# Patient Record
Sex: Male | Born: 1960 | Race: Black or African American | Hispanic: No | Marital: Single | Smoking: Current every day smoker
Health system: Southern US, Community
[De-identification: ages and names within clinical notes are randomized; demographics above are authoritative.]

## PROBLEM LIST (undated history)

## (undated) DIAGNOSIS — F1010 Alcohol abuse, uncomplicated: Secondary | ICD-10-CM

## (undated) DIAGNOSIS — K9423 Gastrostomy malfunction: Secondary | ICD-10-CM

## (undated) DIAGNOSIS — R4020 Unspecified coma: Secondary | ICD-10-CM

---

## 2018-12-30 ENCOUNTER — Emergency Department (HOSPITAL_COMMUNITY)
Admission: EM | Admit: 2018-12-30 | Discharge: 2018-12-31 | Disposition: A | Discharge status: Home / Self Care | Payer: Medicaid Other | Source: Home / Self Care | Attending: Emergency Medicine | Admitting: Emergency Medicine

## 2018-12-30 ENCOUNTER — Other Ambulatory Visit: Payer: Self-pay

## 2018-12-30 DIAGNOSIS — Z5900 Homelessness unspecified: Secondary | ICD-10-CM

## 2018-12-30 DIAGNOSIS — Z5320 Procedure and treatment not carried out because of patient's decision for unspecified reasons: Secondary | ICD-10-CM | POA: Insufficient documentation

## 2018-12-30 DIAGNOSIS — Y908 Blood alcohol level of 240 mg/100 ml or more: Secondary | ICD-10-CM | POA: Insufficient documentation

## 2018-12-30 DIAGNOSIS — Z931 Gastrostomy status: Secondary | ICD-10-CM | POA: Insufficient documentation

## 2018-12-30 DIAGNOSIS — F10920 Alcohol use, unspecified with intoxication, uncomplicated: Secondary | ICD-10-CM
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
## 2018-12-31 ENCOUNTER — Emergency Department (HOSPITAL_COMMUNITY): Payer: Medicaid Other

## 2018-12-31 ENCOUNTER — Inpatient Hospital Stay (HOSPITAL_COMMUNITY)
Admission: EM | Admit: 2018-12-31 | Discharge: 2019-01-01 | DRG: 641 | Discharge status: Against Medical Advice | Payer: Medicaid Other | Attending: Internal Medicine | Admitting: Internal Medicine

## 2018-12-31 ENCOUNTER — Other Ambulatory Visit: Payer: Self-pay

## 2018-12-31 DIAGNOSIS — R7989 Other specified abnormal findings of blood chemistry: Secondary | ICD-10-CM

## 2018-12-31 DIAGNOSIS — Z823 Family history of stroke: Secondary | ICD-10-CM

## 2018-12-31 DIAGNOSIS — F10220 Alcohol dependence with intoxication, uncomplicated: Secondary | ICD-10-CM | POA: Diagnosis present

## 2018-12-31 DIAGNOSIS — H547 Unspecified visual loss: Secondary | ICD-10-CM | POA: Diagnosis present

## 2018-12-31 DIAGNOSIS — Z931 Gastrostomy status: Secondary | ICD-10-CM

## 2018-12-31 DIAGNOSIS — Z20828 Contact with and (suspected) exposure to other viral communicable diseases: Secondary | ICD-10-CM | POA: Diagnosis present

## 2018-12-31 DIAGNOSIS — Z5329 Procedure and treatment not carried out because of patient's decision for other reasons: Secondary | ICD-10-CM | POA: Diagnosis not present

## 2018-12-31 DIAGNOSIS — E872 Acidosis, unspecified: Secondary | ICD-10-CM

## 2018-12-31 DIAGNOSIS — F10939 Alcohol use, unspecified with withdrawal, unspecified: Secondary | ICD-10-CM | POA: Diagnosis present

## 2018-12-31 DIAGNOSIS — E162 Hypoglycemia, unspecified: Secondary | ICD-10-CM | POA: Diagnosis present

## 2018-12-31 DIAGNOSIS — I10 Essential (primary) hypertension: Secondary | ICD-10-CM | POA: Diagnosis present

## 2018-12-31 DIAGNOSIS — E876 Hypokalemia: Secondary | ICD-10-CM

## 2018-12-31 DIAGNOSIS — F10920 Alcohol use, unspecified with intoxication, uncomplicated: Secondary | ICD-10-CM

## 2018-12-31 DIAGNOSIS — Z8249 Family history of ischemic heart disease and other diseases of the circulatory system: Secondary | ICD-10-CM

## 2018-12-31 DIAGNOSIS — Z833 Family history of diabetes mellitus: Secondary | ICD-10-CM

## 2018-12-31 DIAGNOSIS — F10239 Alcohol dependence with withdrawal, unspecified: Secondary | ICD-10-CM | POA: Diagnosis present

## 2018-12-31 DIAGNOSIS — F1020 Alcohol dependence, uncomplicated: Secondary | ICD-10-CM

## 2018-12-31 DIAGNOSIS — B1920 Unspecified viral hepatitis C without hepatic coma: Secondary | ICD-10-CM | POA: Diagnosis present

## 2018-12-31 DIAGNOSIS — Z590 Homelessness: Secondary | ICD-10-CM

## 2018-12-31 LAB — BASIC METABOLIC PANEL
Anion gap: 14 (ref 5–15)
BUN: 8 mg/dL (ref 6–20)
CO2: 14 mmol/L — ABNORMAL LOW (ref 22–32)
Calcium: 8.5 mg/dL — ABNORMAL LOW (ref 8.9–10.3)
Chloride: 110 mmol/L (ref 98–111)
Creatinine, Ser: 1.1 mg/dL (ref 0.61–1.24)
GFR calc Af Amer: >60 mL/min (ref >60)
GFR calc non Af Amer: >60 mL/min (ref >60)
Glucose, Bld: 75 mg/dL (ref 70–99)
Potassium: 3.6 mmol/L (ref 3.5–5.1)
Sodium: 138 mmol/L (ref 135–145)

## 2018-12-31 LAB — COMPREHENSIVE METABOLIC PANEL
ALT: 49 U/L — ABNORMAL HIGH (ref 0–44)
AST: 94 U/L — ABNORMAL HIGH (ref 15–41)
Albumin: 3 g/dL — ABNORMAL LOW (ref 3.5–5.0)
Alkaline Phosphatase: 152 U/L — ABNORMAL HIGH (ref 38–126)
Anion gap: 13 (ref 5–15)
BUN: 9 mg/dL (ref 6–20)
CO2: 18 mmol/L — ABNORMAL LOW (ref 22–32)
Calcium: 8.4 mg/dL — ABNORMAL LOW (ref 8.9–10.3)
Chloride: 109 mmol/L (ref 98–111)
Creatinine, Ser: 1.01 mg/dL (ref 0.61–1.24)
GFR calc Af Amer: >60 mL/min (ref >60)
GFR calc non Af Amer: >60 mL/min (ref >60)
Glucose, Bld: 68 mg/dL — ABNORMAL LOW (ref 70–99)
Potassium: 3.3 mmol/L — ABNORMAL LOW (ref 3.5–5.1)
Sodium: 140 mmol/L (ref 135–145)
Total Bilirubin: 0.7 mg/dL (ref 0.3–1.2)
Total Protein: 9 g/dL — ABNORMAL HIGH (ref 6.5–8.1)

## 2018-12-31 LAB — TROPONIN I (HIGH SENSITIVITY)
Troponin I (High Sensitivity): 4 ng/L (ref <18)
Troponin I (High Sensitivity): 5 ng/L (ref <18)

## 2018-12-31 LAB — CBC WITH DIFFERENTIAL/PLATELET
Abs Immature Granulocytes: 0.01 10*3/uL (ref 0.00–0.07)
Basophils Absolute: 0.2 10*3/uL — ABNORMAL HIGH (ref 0.0–0.1)
Basophils Relative: 3 %
Eosinophils Absolute: 0.8 10*3/uL — ABNORMAL HIGH (ref 0.0–0.5)
Eosinophils Relative: 12 %
HCT: 33.8 % — ABNORMAL LOW (ref 39.0–52.0)
Hemoglobin: 11.4 g/dL — ABNORMAL LOW (ref 13.0–17.0)
Immature Granulocytes: 0 %
Lymphocytes Relative: 48 %
Lymphs Abs: 3 10*3/uL (ref 0.7–4.0)
MCH: 29.6 pg (ref 26.0–34.0)
MCHC: 33.7 g/dL (ref 30.0–36.0)
MCV: 87.8 fL (ref 80.0–100.0)
Monocytes Absolute: 0.6 10*3/uL (ref 0.1–1.0)
Monocytes Relative: 9 %
Neutro Abs: 1.8 10*3/uL (ref 1.7–7.7)
Neutrophils Relative %: 28 %
Platelets: 227 10*3/uL (ref 150–400)
RBC: 3.85 MIL/uL — ABNORMAL LOW (ref 4.22–5.81)
RDW: 14.7 % (ref 11.5–15.5)
WBC: 6.3 10*3/uL (ref 4.0–10.5)
nRBC: 0 % (ref 0.0–0.2)

## 2018-12-31 LAB — CBC
HCT: 35 % — ABNORMAL LOW (ref 39.0–52.0)
Hemoglobin: 11.2 g/dL — ABNORMAL LOW (ref 13.0–17.0)
MCH: 29.8 pg (ref 26.0–34.0)
MCHC: 32 g/dL (ref 30.0–36.0)
MCV: 93.1 fL (ref 80.0–100.0)
Platelets: 142 10*3/uL — ABNORMAL LOW (ref 150–400)
RBC: 3.76 MIL/uL — ABNORMAL LOW (ref 4.22–5.81)
RDW: 15.1 % (ref 11.5–15.5)
WBC: 8.5 10*3/uL (ref 4.0–10.5)
nRBC: 0 % (ref 0.0–0.2)

## 2018-12-31 LAB — CBG MONITORING, ED
Glucose-Capillary: 68 mg/dL — ABNORMAL LOW (ref 70–99)
Glucose-Capillary: 72 mg/dL (ref 70–99)
Glucose-Capillary: 108 mg/dL — ABNORMAL HIGH (ref 70–99)
Glucose-Capillary: 94 mg/dL (ref 70–99)

## 2018-12-31 LAB — LACTIC ACID, PLASMA
Lactic Acid, Venous: 3.8 mmol/L (ref 0.5–1.9)
Lactic Acid, Venous: 3.7 mmol/L (ref 0.5–1.9)

## 2018-12-31 LAB — ETHANOL: Alcohol, Ethyl (B): 289 mg/dL — ABNORMAL HIGH (ref <10)

## 2018-12-31 MED ORDER — SODIUM CHLORIDE 0.9 % IV BOLUS
1000.0000 mL | Freq: Once | INTRAVENOUS | Status: AC
  Administered 2019-01-01: 01:00:00 1000 mL via INTRAVENOUS

## 2018-12-31 MED ORDER — SODIUM CHLORIDE 0.9 % IV BOLUS
1000.0000 mL | Freq: Once | INTRAVENOUS | Status: AC
  Administered 2018-12-31: 1000 mL via INTRAVENOUS

## 2018-12-31 MED ORDER — CHLORDIAZEPOXIDE HCL 25 MG PO CAPS
50.0000 mg | ORAL_CAPSULE | Freq: Once | ORAL | Status: AC
  Administered 2018-12-31: 50 mg via ORAL
  Filled 2018-12-31: qty 2

## 2018-12-31 NOTE — ED Triage Notes (Signed)
Per EMS pt called for CP for x3 days. Pt seen here last day or 2 for same LWBS. Pt refused ASA and nitro from EMS because it hurts his stomach. Pt rates CP 9/10 currently. Pt currently intoxicated, very diaphoretic. Pt has peg tube, urinated on himself en route.

## 2018-12-31 NOTE — ED Notes (Signed)
2nd call for pt to recheck vitals. No response.  
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
## 2018-12-31 NOTE — ED Provider Notes (Signed)
[facility] EMERGENCY DEPARTMENT Provider Note   CSN: [id number] Arrival date & time: 12/31/18  1614    History   Chief Complaint Chief Complaint  Patient presents with   Chest Pain    HPI Luis Wu is a 58 y.o. male.   The history is provided by the patient. The history is limited by the condition of the patient (Intoxicated).  He is extremely intoxicated and is very difficult to get a history for him.  He states that he was here yesterday and he left but was supposed to see a Education officer, museum to help him go through a detox program.  He states that he is a heavy drinker but wants to stop drinking.  Triage note states that he had some chest pain in the patient actually is not complaining of chest pain currently and I cannot get any details from him.  He denies other drug use.  He denies hallucinations, homicidal ideation, suicidal ideation.  No past medical history on file.  There are no active problems to display for this patient.   ** The histories are not reviewed yet. Please review them in the "History" navigator section and refresh this [place].      Home Medications    Prior to Admission medications   Not on File    Family History No family history on file.  Social History Social History   Tobacco Use   Smoking status: Not on file  Substance Use Topics   Alcohol use: Not on file   Drug use: Not on file     Allergies   Patient has no known allergies.   Review of Systems Review of Systems  Unable to perform ROS: Mental status change     Physical Exam Updated Vital Signs BP (!) 147/86    Pulse (!) 104    Temp 98.6 F (37 C) (Oral)    Resp 18    SpO2 97%   Physical Exam Vitals signs and nursing note reviewed.    58 year old male, resting comfortably and in no acute distress. Vital signs are significant for mildly elevated heart rate and blood pressure. Oxygen saturation is 97%, which is normal. Head is normocephalic  and atraumatic. PERRLA, EOMI. Oropharynx is clear. Neck is nontender and supple without adenopathy or JVD.  Scar from prior tracheostomy present. Back is nontender and t here is no CVA tenderness. Lungs are clear without rales, wheezes, or rhonchi. Chest is nontender. Heart has regular rate and rhythm without murmur. Abdomen is soft, flat, with mild epigastric tenderness.  There is no rebound or guarding.  There are no masses or hepatosplenomegaly and peristalsis is normoactive.  PEG tube present left upper quadrant. Extremities have no cyanosis or edema, full range of motion is present. Skin is warm and dry without rash. Neurologic: Clinically intoxicated with slurred speech, cranial nerves are intact, there are no gross motor or sensory deficits.  ED Treatments / Results  Labs (all labs ordered are listed, but only abnormal results are displayed) Labs Reviewed  BASIC METABOLIC PANEL - Abnormal; Notable for the following components:      Result Value   CO2 14 (*)    Calcium 8.5 (*)    All other components within normal limits  CBC - Abnormal; Notable for the following components:   RBC 3.76 (*)    Hemoglobin 11.2 (*)    HCT 35.0 (*)    Platelets 142 (*)    All other components within normal  limits  TROPONIN I (HIGH SENSITIVITY)  TROPONIN I (HIGH SENSITIVITY)    EKG EKG Interpretation  Date/Time:  Thursday December 31 2018 16:16:26 EDT Ventricular Rate:  98 PR Interval:  158 QRS Duration: 92 QT Interval:  362 QTC Calculation: 462 R Axis:   20 Text Interpretation: Normal sinus rhythm Nonspecific T wave abnormality Abnormal ECG No old tracing to compare Confirmed by Dione Booze [phone]) on 12/31/2018 11:00:40 PM   Radiology Dg Chest 2 View  Result Date: 12/31/2018 CLINICAL DATA:  Chest pain EXAM: CHEST - 2 VIEW COMPARISON:  None. FINDINGS: There is mild scarring in the left base. There is no edema or consolidation. Heart size and pulmonary vascularity are normal. No  adenopathy. No pneumothorax. No bone lesions. IMPRESSION: Mild scarring in the left base. No edema or consolidation. Cardiac silhouette within normal limits. Electronically Signed   By: Bretta Bang III M.D.   On: 12/31/2018 16:55    Procedures Procedures   Medications Ordered in ED Medications  sodium chloride 0.9 % bolus 1,000 mL (has no administration in time range)     Initial Impression / Assessment and Plan / ED Course  I have reviewed the triage vital signs and the nursing notes.  Pertinent labs & imaging results that were available during my care of the patient were reviewed by me and considered in my medical decision making (see chart for details).   Alcohol intoxication.  Old records are reviewed, and patient was here yesterday but apparently social service consult was to try to get patient into see a surgeon to have PEG tube removed.  He does not demonstrate any psychiatric disturbance which would warrant TTS evaluation.  Labs do show low CO2 with normal anion gap.  ECG shows no acute changes, troponin is normal x2, chest x-ray is unremarkable.  He will be given IV fluids.  Yesterday, he had an elevated lactic acid.  We will recheck lactic acid.  Lactic acid level has come down, but not completely to normal.  He continues to run a normal anion gap metabolic acidosis.  Glucose is also coming down and he is given a dose of D50.  He is also given a dose of oral potassium because potassium dropped to 3.0.  It is felt that he will need more observation as an inpatient.  Case is discussed with Dr. Crista Elliot of internal medicine teaching service agrees to admit the patient.  Final Clinical Impressions(s) / ED Diagnoses   Final diagnoses:  Metabolic acidosis  Elevated lactic acid level  Alcohol intoxication, uncomplicated (HCC)  Alcohol use disorder, severe, dependence (HCC)  Hypokalemia    ED Discharge Orders    None       Dione Booze, MD 01/01/19 [phone]

## 2018-12-31 NOTE — ED Notes (Addendum)
Called pt to recheck vitals. No response.  

## 2018-12-31 NOTE — ED Notes (Signed)
MD Melina Copa discharged pt due to his unwillingly to stay to wait on his fluids to infuse as well as social work consult. Pt states he has plan to take bus [facility]. Pt wanted to be taken to a ATM machine so was discharged and sectary taken to atm.

## 2018-12-31 NOTE — ED Provider Notes (Signed)
Signout from Dr. Leonette Monarch.  58 year old male recently moved here, homeless without a PCP, here with alcohol intoxication found less responsive.  Patient himself is looking for somebody to evaluate his trach site and wants his PEG removed. Physical Exam  BP (!) 144/87 (BP Location: Left Arm)   Pulse 86   Temp 98.6 F (37 C) (Oral)   Resp 18   SpO2 100%   Physical Exam  ED Course/Procedures     Procedures  MDM  Lactate elevated at 3.8 and minimally cleared after some IV fluids.  Patient getting more IV fluids and some oral Librium for possible withdrawal symptoms.  Case management consult has been placed.  Plan is likely discharge after more fluids and repeat labs showing an improved BMP and lactate.  8:20 AM.  Patient is received some Librium but have been informed by the nurse that he says he needs to leave.  He says he wants to go to an ATM and then go get some liquor and then he will come back and be evaluated again.  He understands that he will need to sign in again.  We also talked about the fact that he has an elevated lactate and may be sicker than he realizes.  He declines to stay here for a more treatment and for discussion with case management about his options for housing.       Hayden Rasmussen, MD 12/31/18 [phone]

## 2018-12-31 NOTE — ED Notes (Signed)
Last call for pt. No response.  

## 2018-12-31 NOTE — ED Provider Notes (Signed)
[facility] EMERGENCY [facility]PART[facility] Provider Note  CSN: [id number] Arrival date & time: 12/30/18 1456  Chief Complaint(s) Alcohol Intoxication  HPI Luis Wu is a 58 y.o. male who was brought in by EMS after somebody called out for likely alcohol intoxication.  Patient was found with bottles of liquor around him.  Patient apparently moved from [place] 2 days ago.  He has bandage on his anterior neck from a trach removal.  He reports that the trach was removed 1 month ago and his bandage has been on for a month.  Patient also has a PEG tube in place.  He reports that he is able to drink and eat and does not need the PEG tube anymore.  He requests it be taken out.  Patient does not remember why he had these procedures.  Currently has no physical complaints.  Reports that he does not have a place to stay.   HPI  Past Medical History No past medical history on file. There are no active problems to display for this patient.  Home Medication(s) Prior to Admission medications   Not on File                                                                                                                                    Past Surgical History ** The histories are not reviewed yet. Please review them in the "History" navigator section and refresh this [place]. Family History No family history on file.  Social History Social History   Tobacco Use  . Smoking status: Not on file  Substance Use Topics  . Alcohol use: Not on file  . Drug use: Not on file   Allergies Patient has no known allergies.  Review of Systems Review of Systems All other systems are reviewed and are negative for acute change except as noted in the HPI  Physical Exam Vital Signs  I have reviewed the triage vital signs BP (!) 144/87 (BP Location: Left Arm)   Pulse 86   Temp 98.6 F (37 C) (Oral)   Resp 18   SpO2 100%   Physical Exam Vitals signs reviewed.  Constitutional:    General: He is not in acute distress.    Appearance: He is well-developed. He is not diaphoretic.  HENT:     Head: Normocephalic and atraumatic.     Nose: Nose normal.  Eyes:     General: No scleral icterus.       Right eye: No discharge.        Left eye: No discharge.     Conjunctiva/sclera: Conjunctivae normal.     Pupils: Pupils are equal, round, and reactive to light.  Neck:     Musculoskeletal: Normal range of motion and neck supple.   Cardiovascular:     Rate and Rhythm: Normal rate and regular rhythm.     Heart sounds: No murmur. No friction rub. No gallop.  Pulmonary:     Effort: Pulmonary effort is normal. No respiratory distress.     Breath sounds: Normal breath sounds. No stridor. No rales.  Abdominal:     General: There is no distension.     Palpations: Abdomen is soft.     Tenderness: There is no abdominal tenderness.    Musculoskeletal:        General: No tenderness.  Skin:    General: Skin is warm and dry.     Findings: No erythema or rash.  Neurological:     Mental Status: He is alert and oriented to person, place, and time.       ED Results and Treatments Labs (all labs ordered are listed, but only abnormal results are displayed) Labs Reviewed  LACTIC ACID, PLASMA - Abnormal; Notable for the following components:      Result Value   Lactic Acid, Venous 3.8 (*)    All other components within normal limits  COMPREHENSIVE METABOLIC PANEL - Abnormal; Notable for the following components:   Potassium 3.3 (*)    CO2 18 (*)    Glucose, Bld 68 (*)    Calcium 8.4 (*)    Total Protein 9.0 (*)    Albumin 3.0 (*)    AST 94 (*)    ALT 49 (*)    Alkaline Phosphatase 152 (*)    All other components within normal limits  CBC WITH DIFFERENTIAL/PLATELET - Abnormal; Notable for the following components:   RBC 3.85 (*)    Hemoglobin 11.4 (*)    HCT 33.8 (*)    Eosinophils Absolute 0.8 (*)    Basophils Absolute 0.2 (*)    All other components within normal  limits  ETHANOL - Abnormal; Notable for the following components:   Alcohol, Ethyl (B) 289 (*)    All other components within normal limits  CBG MONITORING, ED - Abnormal; Notable for the following components:   Glucose-Capillary 68 (*)    All other components within normal limits  CBG MONITORING, ED - Abnormal; Notable for the following components:   Glucose-Capillary 108 (*)    All other components within normal limits  LACTIC ACID, PLASMA  URINALYSIS, ROUTINE W REFLEX MICROSCOPIC  LACTIC ACID, PLASMA  CBG MONITORING, ED  CBG MONITORING, ED                                                                                                                         EKG  EKG Interpretation  Date/Time:    Ventricular Rate:    PR Interval:    QRS Duration:   QT Interval:    QTC Calculation:   R Axis:     Text Interpretation:        Radiology No results found.  Pertinent labs & imaging results that were available during my care of the patient were reviewed by me and considered in my medical decision making (see chart for details).  Medications Ordered in ED Medications  sodium chloride  0.9 % bolus 1,000 mL (0 mLs Intravenous Stopped 12/31/18 0411)                                                                                                                                    Procedures Procedures  (including critical care time)  Medical Decision Making / ED Course I have reviewed the nursing notes for this encounter and the patient's prior records (if available in EHR or on provided paperwork).   Ademola Dion was evaluated in Emergency Department on 12/31/2018 for the symptoms described in the history of present illness. He was evaluated in the context of the global COVID-19 pandemic, which necessitated consideration that the patient might be at risk for infection with the SARS-CoV-2 virus that causes COVID-19. Institutional protocols and algorithms that pertain to the  evaluation of patients at risk for COVID-19 are in a state of rapid change based on information released by regulatory bodies including the CDC and federal and state organizations. These policies and algorithms were followed during the patient's care in the ED.  Brought in for alcohol intoxication. Janina Mayo site is well-appearing without evidence of infection. PEG tube in place without evidence of infection.  Abdomen benign. Labs without leukocytosis or significant anemia; no significant electrolyte derangements.  Patient does have mild hypoglycemia.  Given food and oral hydration. EtOH still elevated. Lactic acid also elevated.  Patient afebrile without source of infection.  Likely related to alcohol.  Patient given IV fluids.  Repeat lactic acid pending.  Will consult case management to assist with surgery follow up for PEG removal.   Patient care turned over to Dr Charm Barges. Patient case and results discussed in detail; please see their note for further ED managment.       Final Clinical Impression(s) / ED Diagnoses Final diagnoses:  Alcoholic intoxication without complication (HCC)  Homeless  PEG (percutaneous endoscopic gastrostomy) status (HCC)      This chart was dictated using voice recognition software.  Despite best efforts to proofread,  errors can occur which can change the documentation meaning.   Nira Conn, MD 12/31/18 [phone]

## 2019-01-01 ENCOUNTER — Emergency Department (HOSPITAL_COMMUNITY)
Admission: EM | Admit: 2019-01-01 | Discharge: 2019-01-02 | Discharge status: Against Medical Advice | Payer: Medicaid Other | Attending: Emergency Medicine | Admitting: Emergency Medicine

## 2019-01-01 ENCOUNTER — Inpatient Hospital Stay (HOSPITAL_COMMUNITY): Payer: Medicaid Other

## 2019-01-01 DIAGNOSIS — B1920 Unspecified viral hepatitis C without hepatic coma: Secondary | ICD-10-CM | POA: Diagnosis present

## 2019-01-01 DIAGNOSIS — Z833 Family history of diabetes mellitus: Secondary | ICD-10-CM | POA: Diagnosis not present

## 2019-01-01 DIAGNOSIS — F1020 Alcohol dependence, uncomplicated: Secondary | ICD-10-CM | POA: Insufficient documentation

## 2019-01-01 DIAGNOSIS — F10239 Alcohol dependence with withdrawal, unspecified: Secondary | ICD-10-CM | POA: Diagnosis present

## 2019-01-01 DIAGNOSIS — Z5321 Procedure and treatment not carried out due to patient leaving prior to being seen by health care provider: Secondary | ICD-10-CM | POA: Diagnosis not present

## 2019-01-01 DIAGNOSIS — F10220 Alcohol dependence with intoxication, uncomplicated: Secondary | ICD-10-CM | POA: Diagnosis present

## 2019-01-01 DIAGNOSIS — F1010 Alcohol abuse, uncomplicated: Secondary | ICD-10-CM | POA: Diagnosis present

## 2019-01-01 DIAGNOSIS — Z823 Family history of stroke: Secondary | ICD-10-CM | POA: Diagnosis not present

## 2019-01-01 DIAGNOSIS — Z5900 Homelessness unspecified: Secondary | ICD-10-CM

## 2019-01-01 DIAGNOSIS — F10139 Alcohol abuse with withdrawal, unspecified: Secondary | ICD-10-CM

## 2019-01-01 DIAGNOSIS — Z930 Tracheostomy status: Secondary | ICD-10-CM

## 2019-01-01 DIAGNOSIS — Z5329 Procedure and treatment not carried out because of patient's decision for other reasons: Secondary | ICD-10-CM | POA: Diagnosis not present

## 2019-01-01 DIAGNOSIS — F10129 Alcohol abuse with intoxication, unspecified: Secondary | ICD-10-CM | POA: Diagnosis present

## 2019-01-01 DIAGNOSIS — E872 Acidosis, unspecified: Secondary | ICD-10-CM

## 2019-01-01 DIAGNOSIS — F10929 Alcohol use, unspecified with intoxication, unspecified: Secondary | ICD-10-CM | POA: Insufficient documentation

## 2019-01-01 DIAGNOSIS — Z20828 Contact with and (suspected) exposure to other viral communicable diseases: Secondary | ICD-10-CM | POA: Diagnosis present

## 2019-01-01 DIAGNOSIS — H547 Unspecified visual loss: Secondary | ICD-10-CM | POA: Diagnosis present

## 2019-01-01 DIAGNOSIS — F10939 Alcohol use, unspecified with withdrawal, unspecified: Secondary | ICD-10-CM | POA: Diagnosis present

## 2019-01-01 DIAGNOSIS — I10 Essential (primary) hypertension: Secondary | ICD-10-CM

## 2019-01-01 DIAGNOSIS — E876 Hypokalemia: Secondary | ICD-10-CM | POA: Diagnosis present

## 2019-01-01 DIAGNOSIS — Z8249 Family history of ischemic heart disease and other diseases of the circulatory system: Secondary | ICD-10-CM | POA: Diagnosis not present

## 2019-01-01 DIAGNOSIS — E162 Hypoglycemia, unspecified: Secondary | ICD-10-CM | POA: Diagnosis present

## 2019-01-01 DIAGNOSIS — Z931 Gastrostomy status: Secondary | ICD-10-CM | POA: Diagnosis not present

## 2019-01-01 LAB — BASIC METABOLIC PANEL
Anion gap: 13 (ref 5–15)
Anion gap: 10 (ref 5–15)
BUN: 5 mg/dL — ABNORMAL LOW (ref 6–20)
BUN: 5 mg/dL — ABNORMAL LOW (ref 6–20)
CO2: 17 mmol/L — ABNORMAL LOW (ref 22–32)
CO2: 15 mmol/L — ABNORMAL LOW (ref 22–32)
Calcium: 8.5 mg/dL — ABNORMAL LOW (ref 8.9–10.3)
Calcium: 6.6 mg/dL — ABNORMAL LOW (ref 8.9–10.3)
Chloride: 108 mmol/L (ref 98–111)
Chloride: 117 mmol/L — ABNORMAL HIGH (ref 98–111)
Creatinine, Ser: 1.13 mg/dL (ref 0.61–1.24)
Creatinine, Ser: 0.86 mg/dL (ref 0.61–1.24)
GFR calc Af Amer: >60 mL/min (ref >60)
GFR calc Af Amer: >60 mL/min (ref >60)
GFR calc non Af Amer: >60 mL/min (ref >60)
GFR calc non Af Amer: >60 mL/min (ref >60)
Glucose, Bld: 77 mg/dL (ref 70–99)
Glucose, Bld: 67 mg/dL — ABNORMAL LOW (ref 70–99)
Potassium: 3.3 mmol/L — ABNORMAL LOW (ref 3.5–5.1)
Potassium: 3 mmol/L — ABNORMAL LOW (ref 3.5–5.1)
Sodium: 138 mmol/L (ref 135–145)
Sodium: 142 mmol/L (ref 135–145)

## 2019-01-01 LAB — RAPID URINE DRUG SCREEN, HOSP PERFORMED
Amphetamines: NOT DETECTED
Barbiturates: NOT DETECTED
Benzodiazepines: POSITIVE — AB
Cocaine: NOT DETECTED
Opiates: NOT DETECTED
Tetrahydrocannabinol: NOT DETECTED

## 2019-01-01 LAB — LACTIC ACID, PLASMA
Lactic Acid, Venous: 3.3 mmol/L (ref 0.5–1.9)
Lactic Acid, Venous: 2 mmol/L (ref 0.5–1.9)

## 2019-01-01 LAB — ETHANOL: Alcohol, Ethyl (B): 262 mg/dL — ABNORMAL HIGH (ref <10)

## 2019-01-01 MED ORDER — ADULT MULTIVITAMIN W/MINERALS CH: 1.0000 | ORAL_TABLET | Freq: Every day | ORAL | Status: DC

## 2019-01-01 MED ORDER — POTASSIUM CHLORIDE CRYS ER 20 MEQ PO TBCR
40.0000 meq | EXTENDED_RELEASE_TABLET | Freq: Once | ORAL | Status: AC
  Administered 2019-01-01: 07:00:00 40 meq via ORAL
  Filled 2019-01-01: qty 2

## 2019-01-01 MED ORDER — ACETAMINOPHEN 325 MG PO TABS: 650.0000 mg | ORAL_TABLET | Freq: Four times a day (QID) | ORAL | Status: DC | PRN

## 2019-01-01 MED ORDER — AMLODIPINE BESYLATE 5 MG PO TABS: 5.0000 mg | ORAL_TABLET | Freq: Every day | ORAL | Status: DC

## 2019-01-01 MED ORDER — LORAZEPAM 2 MG/ML IJ SOLN
1.0000 mg | INTRAMUSCULAR | Status: DC | PRN
  Administered 2019-01-01: 08:00:00 2 mg via INTRAVENOUS
  Filled 2019-01-01: qty 1

## 2019-01-01 MED ORDER — THIAMINE HCL 100 MG/ML IJ SOLN: 100.0000 mg | Freq: Every day | INTRAMUSCULAR | Status: DC

## 2019-01-01 MED ORDER — LORAZEPAM 1 MG PO TABS: 1.0000 mg | ORAL_TABLET | ORAL | Status: DC | PRN

## 2019-01-01 MED ORDER — DEXTROSE 50 % IV SOLN
50.0000 mL | Freq: Once | INTRAVENOUS | Status: AC
  Administered 2019-01-01: 07:00:00 50 mL via INTRAVENOUS
  Filled 2019-01-01: qty 50

## 2019-01-01 MED ORDER — FOLIC ACID 1 MG PO TABS: 1.0000 mg | ORAL_TABLET | Freq: Every day | ORAL | Status: DC

## 2019-01-01 MED ORDER — CHLORDIAZEPOXIDE HCL 25 MG PO CAPS: 25.0000 mg | ORAL_CAPSULE | Freq: Four times a day (QID) | ORAL | Status: DC

## 2019-01-01 MED ORDER — SODIUM CHLORIDE 0.9 % IV BOLUS
1000.0000 mL | Freq: Once | INTRAVENOUS | Status: AC
  Administered 2019-01-01: 1000 mL via INTRAVENOUS

## 2019-01-01 MED ORDER — CHLORDIAZEPOXIDE HCL 25 MG PO CAPS: 25.0000 mg | ORAL_CAPSULE | Freq: Three times a day (TID) | ORAL | Status: DC

## 2019-01-01 MED ORDER — ACETAMINOPHEN 650 MG RE SUPP: 650.0000 mg | Freq: Four times a day (QID) | RECTAL | Status: DC | PRN

## 2019-01-01 MED ORDER — VITAMIN B-1 100 MG PO TABS: 100.0000 mg | ORAL_TABLET | Freq: Every day | ORAL | Status: DC

## 2019-01-01 MED ORDER — ENOXAPARIN SODIUM 40 MG/0.4ML ~~LOC~~ SOLN: 40.0000 mg | SUBCUTANEOUS | Status: DC

## 2019-01-01 NOTE — ED Notes (Signed)
Pt remains agitated walking around nursing station.  Emphatic about leaving.  Resident paged and here to review his sx, pt refuses to stay or sign AMA.  IV dc'd and left ambulatory with his belongings.

## 2019-01-01 NOTE — ED Notes (Signed)
Pt ambulated self efficiently to restroom with no difficulty. 

## 2019-01-01 NOTE — H&P (Signed)
Date: 01/01/2019               Patient Name:  Luis Wu MRN: [id number]  DOB: 1960-04-26 Age / Sex: 58 y.o., male   PCP: System, Pcp Not In         Medical Service: Internal Medicine Teaching Service         Attending Physician: Dr. Rayne Du att. providers found    First Contact: Dr. Gilford Rile Pager: [phone]  Second Contact: Dr. Myrtie Hawk Pager: [phone]       After Hours (After 5p/  First Contact Pager: [phone]  weekends / holidays): Second Contact Pager: [phone]   Chief Complaint: "I want to detox from alcohol"  History of Present Illness: Luis Wu is a 58 year old male with past medical history notable for hypertension, EtOH use disorder, recent hospitalization with tracheostomy and PEG tube placement for unspecified etiology who presented to [facility] emergency department with a desire for [place] to assist him with his alcohol use disorder.  The patient states that he no longer wishes to use alcohol and has been experiencing diaphoresis and tremors for the past 10 hours since his last drink 20 hours ago.  The patient endorses chronic stable poor vision, mild headache, tremor, diaphoresis, and weakness.  He denies hallucinations, syncope, confusion.  Patient denies fever but endorses chills.  He denies chest pain, abdominal pain, dysuria, hematuria, constipation, diarrhea, joint aches or pains, myalgias or rash. He states that although he was born in [place] he had moved and lived in [place], [place] the majority of his life.  At some point he states that due to alcohol use disorder he found himself admitted to the hospital in [place], he believes this was [facility].  He does not recall what medical condition prompted the need for placement of a PEG tube which remains in place and or tracheostomy with evidence of recent closure given the persistent presence of Dermabond and the stage of the wound.  The patient stated that he purchased a bus ticket for his ride  to [facility] has been staying in a hotel since arrival.  He does not remember how long he has been present.  He is agreeable to stay for treatment.  He denies having family or friends who could assist with explaining his condition but did provide the number to his ex-wife at [phone].  Meds:  The patient denied having prescriptions for any medications prior to admission.  Past medical history: The patient endorses a past medical history of hypertension and hepatitis C (untreated)  Allergies: The patient denied any known drug allergies.  Family History:  The patient endorsed a family history of stroke in his mother in her 35s but she remains living The patient believes his father had diabetes and hypertension  Social History:  He is currently on home Does not smoke, has used crack cocaine in the past Consumes at minimum 60 ounces of beer daily and at least 1 pint of wine daily  Review of Systems: A complete ROS was negative except as per HPI.   Physical Exam: Blood pressure (!) 144/98, pulse 95, temperature 98.6 F (37 C), temperature source Oral, resp. rate (!) 24, SpO2 98 %. Physical Exam Vitals signs and nursing note reviewed.  Constitutional:      General: He is not in acute distress.    Appearance: He is well-developed. He is not diaphoretic.  HENT:     Head: Normocephalic and atraumatic.  Eyes:  Conjunctiva/sclera: Conjunctivae normal.  Neck:     Musculoskeletal: Normal range of motion.  Cardiovascular:     Rate and Rhythm: Regular rhythm. Tachycardia present.     Heart sounds: No murmur.  Pulmonary:     Effort: Pulmonary effort is normal. No respiratory distress.     Breath sounds: Normal breath sounds. No stridor.  Abdominal:     General: Bowel sounds are normal. There is no distension.     Palpations: Abdomen is soft.  Skin:    General: Skin is warm.  Neurological:     Mental Status: He is alert and oriented to person, place, and time.       Motor: Tremor present.     Comments: The patient is alert and oriented but with slow response time.    EKG: personally reviewed my interpretation is NSR  CXR: personally reviewed my interpretation is unremarkable, no acute cardiopulmonary process, no bony fractures noted  Assessment & Plan by Problem: Principal Problem:   Alcohol withdrawal (HCC) Active Problems:   Acidosis, metabolic   Hypokalemia  Assessment: [name] is a 58 year old male with past medical history notable for hypertension, EtOH use disorder, recent hospitalization with tracheostomy and PEG tube placement for unspecified etiology who presented to [facility] emergency department with a desire for [place] to assist him with his alcohol use disorder.  Presentation the patient was noted to have a marked tremor and a mildly decreased response time.  Additionally he was found to have a potassium of 3.0, lactate of 2.0 improved from 3.8, bicarb of 15, creatinine 0.86, with no anion gap.  He was admitted for evaluation of his lactic acidosis and nongap acidosis.  Plan: EtOH abuse with withdrawal: Marked use disorder, to the extent that the patient has no recollection of his history of presentation.  He consumes alcohol in great excess daily and has had withdrawal symptoms in the past.  Currently not experiencing hallucinations but tremor and diaphoresis are present.  The patient met admission criteria for alcohol withdrawal was admitted to telemetry bed.  The patient also has a mild elevation in his AST, ALT and alk phos which warrants further evaluation. -Monitor electrolytes including Phos, mag and potassium daily -CIWA MedSurg protocol every 6 hours as needed with Ativan -Librium 25 4 times daily on day 1 tapering dose -CMP to monitor transaminases  Patient with a history of hepatitis C: We will order serologies to evaluate hepatitis C in order B. -Hepatitis C antibody with reflex ordered -Hepatitis B core antibody,  surface antibody and antigen ordered  PEG tube: Etiology of need uncertain. Patient does not recall. Has on contact information. We will attempt to reach out to his hospital if he returns.  --KUB for status  Nongap metabolic acidosis: His acidosis was initially at least partially explained by his elevated lactate.  Etiology uncertain additional labs were ordered but unfortunately patient opted to leave [place] prior to completion of his work-up.  HTN: Marked hypertension on admission.  Patient endorses no history of treated high blood pressure in the past. -Initiate amlodipine 5 mg daily  Diet: Regular Fluids: N/A Code: Full DVT PPX: Lovenox Dispo: Admit patient to Inpatient with expected length of stay greater than 2 midnights.  Signed: Kathi Ludwig, MD 01/01/2019, 9:16 AM  Pager: # [phone]  Prior to completion of this note, I was paged by nursing staff that the patient was wishing to leave [place].  Fortunately, I arrived in time to the patient's room  to discuss this with him, to which she is adamant.  The patient refuses to remain hospitalized but is alert and oriented and appears to be of sound mind capable of making this decision.  He denied having additional contacts for me to reach out to him.  I informed him that I had called the number he had previously given me and when the individual answered she did not give her name but stated that the last she had heard Mr. Cocozza was that he was in [place].  When I informed her that he had given me permission to discuss his care with her, she disconnected the phone call.  I again informed the patient that he would be leaving the hospital [place] given his multiple lab derangements.  He again refused to stay and said that at some point he may return he felt like it.

## 2019-01-01 NOTE — ED Notes (Signed)
Card has been found, pt medicated with Ativan and remains anxious and up walking in room, despite multiple attempts to redirect.  Pt states now he is leaving once he found the amount of money on his card.  Balance obtained with assist of tech.

## 2019-01-01 NOTE — ED Triage Notes (Signed)
[facility] after bystanders witnessed pt staggering. Pt reports only had a few beers. Pt wanting to have his gtube looked at while he is here. 124/86, HR 104, 96% RA, 98.58F CBG 97.

## 2019-01-02 MED ORDER — CHLORDIAZEPOXIDE HCL 25 MG PO CAPS
25.0000 mg | ORAL_CAPSULE | Freq: Once | ORAL | Status: AC
  Administered 2019-01-02: 25 mg via ORAL
  Filled 2019-01-02: qty 1

## 2019-01-02 NOTE — ED Notes (Signed)
Pt very agitated and wants to go outside to smoke and then come back in. Informed pt if he leaves he will be doing so AMA. PA aware. Pt states he doesn't care and will check himself back in if he wants to. This RN attempted several times to encourage pt to stay so we could best help him. Pt refusing help at this time. Pt getting dressed.

## 2019-01-02 NOTE — Discharge Instructions (Signed)
Resources attached for local detox centers, AA, etc.  You can call and try to facilitate.

## 2019-01-02 NOTE — ED Notes (Signed)
Pt refused to sign AMA form. Pt ambulatory to lobby and is A/OX4

## 2019-01-02 NOTE — ED Notes (Signed)
Pt. Ambulated to RR without assist. Pt. Has a steady gait.

## 2019-01-02 NOTE — ED Provider Notes (Signed)
[facility] EMERGENCY DEPARTMENT Provider Note   CSN: [id number] Arrival date & time: 01/01/19  1710     History   Chief Complaint Chief Complaint  Patient presents with  . Alcohol Intoxication    HPI Luis Wu is a 58 y.o. male.     The history is provided by the patient and medical records.  Alcohol Intoxication    58 year old male with history of alcohol abuse, PEG tube placement, presenting to the ED for suspected alcohol intoxication.  Patient was reportedly staggering around outside of [employer] and bystander called EMS.  Patient does admit to drinking several beers today, last drink was just PTA.  He reports he drinks on a daily basis.  He denies illicit drug use.  Patient also reports he wants his G-tube taken out.  States it has been in for about a month, was placed in [place], [place].  He is not able to clearly tell me why this was placed.  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  Patient states he is able to eat and drink without issue.  He is not had any vomiting or shortness of breath.  States he came to [place] from [place] in to live with his family, however  now none of his family members are willing to see him or help him.  Of note, patient seen 10/29 x2 for similar complaints.  No past medical history on file.  Patient Active Problem List   Diagnosis Date Noted  . Alcohol intoxication ([place]) 01/01/2019  . Acidosis, metabolic 54/27/0623  . Alcohol withdrawal ([place]) 01/01/2019  . Hypokalemia 01/01/2019  . Alcohol use disorder, severe, dependence ([place])   . PEG (percutaneous endoscopic gastrostomy) status ([place])        Home Medications    Prior to Admission medications   Not on File    Family History No family history on file.  Social History Social History   Tobacco Use  . Smoking status: Not on file  Substance Use Topics  . Alcohol use: Not on file  . Drug use: Not on file      Allergies   Patient has no known allergies.   Review of Systems Review of Systems  Psychiatric/Behavioral:       EtOH  All other systems reviewed and are negative.    Physical Exam Updated Vital Signs BP (!) 123/95 (BP Location: Left Arm)   Pulse 85   Temp 98.8 F (37.1 C)   Resp 18   SpO2 100%   Physical Exam Vitals signs and nursing note reviewed.  Constitutional:      Appearance: He is well-developed.  HENT:     Head: Normocephalic and atraumatic.  Eyes:     Conjunctiva/sclera: Conjunctivae normal.     Pupils: Pupils are equal, round, and reactive to light.  Neck:     Musculoskeletal: Normal range of motion.     Comments: Prior tracheostomy, healed Cardiovascular:     Rate and Rhythm: Normal rate and regular rhythm.     Heart sounds: Normal heart sounds.  Pulmonary:     Effort: Pulmonary effort is normal.     Breath sounds: Normal breath sounds.  Abdominal:     General: Bowel sounds are normal.     Palpations: Abdomen is soft.     Comments: PEG tube in place, skin mildly irritated, no drainage, overall appears clean  Musculoskeletal: Normal range of motion.  Skin:    General:  Skin is warm and dry.  Neurological:     Mental Status: He is alert and oriented to person, place, and time.      ED Treatments / Results  Labs (all labs ordered are listed, but only abnormal results are displayed) Labs Reviewed - No data to display  EKG None  Radiology Dg Chest 2 View  Result Date: 12/31/2018 CLINICAL DATA:  Chest pain EXAM: CHEST - 2 VIEW COMPARISON:  None. FINDINGS: There is mild scarring in the left base. There is no edema or consolidation. Heart size and pulmonary vascularity are normal. No adenopathy. No pneumothorax. No bone lesions. IMPRESSION: Mild scarring in the left base. No edema or consolidation. Cardiac silhouette within normal limits. Electronically Signed   By: Bretta Bang III M.D.   On: 12/31/2018 16:55    Procedures Procedures  (including critical care time)  Medications Ordered in ED Medications - No data to display   Initial Impression / Assessment and Plan / ED Course  I have reviewed the triage vital signs and the nursing notes.  Pertinent labs & imaging results that were available during my care of the patient were reviewed by me and considered in my medical decision making (see chart for details).  58 year old male brought to ED after bystanders saw him static red-brown outside of [employer].  He does admit to drinking several beers today.  He has a history of chronic alcoholism, drinks daily.  He was actually admitted to the hospital 2 days ago but signed out AMA to go to the liquor store.  Patient had prolonged waiting time, has been here nearly 12 hours at time of my evaluation.  He is in no acute distress, awake, alert, no longer appearing intoxicated.  He does not appear tremulous and is not having any current signs of withdrawal but given prolonged period since last drink, will give dose of librium.  Vitals are stable.    Patient is inquiring about having his G-tube removed, discussed that this will not be done in the ER.  Patient currently homeless without any family or relatives here in [place].  He has no money, phone, or vehicle and is unsure how he is going to get medical care.  He is requesting to speak with a Child psychotherapist to help "stop drinking."  I discussed with him that we do not place patients for detox from the ER, however I will provide him with outpatient resources for this.  He does want to speak to a Child psychotherapist about how to facilitate medical care regarding his G-tube, etc.  Consult to case management placed for AM, likely can be discharged afterwards.  Care signed out to oncoming provider.  Final Clinical Impressions(s) / ED Diagnoses   Final diagnoses:  Alcohol abuse  Homelessness    ED Discharge Orders    None       Garlon Hatchet, PA-C 01/02/19 7824     Dione Booze, MD 01/02/19 [phone]

## 2019-01-02 NOTE — ED Notes (Signed)
Pt is sleeping at this time.

## 2019-01-03 ENCOUNTER — Emergency Department (HOSPITAL_COMMUNITY)
Admission: EM | Admit: 2019-01-03 | Discharge: 2019-01-04 | Discharge status: Against Medical Advice | Payer: Medicaid Other | Attending: Emergency Medicine | Admitting: Emergency Medicine

## 2019-01-03 ENCOUNTER — Encounter (HOSPITAL_COMMUNITY): Payer: Self-pay

## 2019-01-03 DIAGNOSIS — Z590 Homelessness: Secondary | ICD-10-CM | POA: Diagnosis not present

## 2019-01-03 DIAGNOSIS — Z931 Gastrostomy status: Secondary | ICD-10-CM | POA: Diagnosis not present

## 2019-01-03 DIAGNOSIS — F10120 Alcohol abuse with intoxication, uncomplicated: Secondary | ICD-10-CM | POA: Insufficient documentation

## 2019-01-03 DIAGNOSIS — F10920 Alcohol use, unspecified with intoxication, uncomplicated: Secondary | ICD-10-CM

## 2019-01-03 DIAGNOSIS — F1010 Alcohol abuse, uncomplicated: Secondary | ICD-10-CM

## 2019-01-03 DIAGNOSIS — R1032 Left lower quadrant pain: Secondary | ICD-10-CM | POA: Diagnosis not present

## 2019-01-03 DIAGNOSIS — R7989 Other specified abnormal findings of blood chemistry: Secondary | ICD-10-CM | POA: Insufficient documentation

## 2019-01-03 DIAGNOSIS — Y904 Blood alcohol level of 80-99 mg/100 ml: Secondary | ICD-10-CM | POA: Insufficient documentation

## 2019-01-03 DIAGNOSIS — R0789 Other chest pain: Secondary | ICD-10-CM | POA: Diagnosis not present

## 2019-01-03 NOTE — ED Triage Notes (Addendum)
Pt comes via [facility] for L flank pain from a fall that he a few months ago, denies any new falls, pt appears intoxicated, reports drinking a fifth a wine today and wants to speak with a Education officer, museum as well.

## 2019-01-04 ENCOUNTER — Other Ambulatory Visit: Payer: Self-pay

## 2019-01-04 ENCOUNTER — Emergency Department (HOSPITAL_COMMUNITY): Payer: Medicaid Other

## 2019-01-04 LAB — COMPREHENSIVE METABOLIC PANEL
ALT: 58 U/L — ABNORMAL HIGH (ref 0–44)
AST: 149 U/L — ABNORMAL HIGH (ref 15–41)
Albumin: 3.6 g/dL (ref 3.5–5.0)
Alkaline Phosphatase: 156 U/L — ABNORMAL HIGH (ref 38–126)
Anion gap: 12 (ref 5–15)
BUN: 6 mg/dL (ref 6–20)
CO2: 18 mmol/L — ABNORMAL LOW (ref 22–32)
Calcium: 8.6 mg/dL — ABNORMAL LOW (ref 8.9–10.3)
Chloride: 106 mmol/L (ref 98–111)
Creatinine, Ser: 1.51 mg/dL — ABNORMAL HIGH (ref 0.61–1.24)
GFR calc Af Amer: 59 mL/min — ABNORMAL LOW (ref >60)
GFR calc non Af Amer: 51 mL/min — ABNORMAL LOW (ref >60)
Glucose, Bld: 86 mg/dL (ref 70–99)
Potassium: 4.7 mmol/L (ref 3.5–5.1)
Sodium: 136 mmol/L (ref 135–145)
Total Bilirubin: 1.1 mg/dL (ref 0.3–1.2)
Total Protein: 9 g/dL — ABNORMAL HIGH (ref 6.5–8.1)

## 2019-01-04 LAB — CBC WITH DIFFERENTIAL/PLATELET
Abs Immature Granulocytes: 0.01 10*3/uL (ref 0.00–0.07)
Basophils Absolute: 0.1 10*3/uL (ref 0.0–0.1)
Basophils Relative: 1 %
Eosinophils Absolute: 0.9 10*3/uL — ABNORMAL HIGH (ref 0.0–0.5)
Eosinophils Relative: 15 %
HCT: 32.5 % — ABNORMAL LOW (ref 39.0–52.0)
Hemoglobin: 10.3 g/dL — ABNORMAL LOW (ref 13.0–17.0)
Immature Granulocytes: 0 %
Lymphocytes Relative: 30 %
Lymphs Abs: 1.8 10*3/uL (ref 0.7–4.0)
MCH: 29.2 pg (ref 26.0–34.0)
MCHC: 31.7 g/dL (ref 30.0–36.0)
MCV: 92.1 fL (ref 80.0–100.0)
Monocytes Absolute: 0.8 10*3/uL (ref 0.1–1.0)
Monocytes Relative: 14 %
Neutro Abs: 2.4 10*3/uL (ref 1.7–7.7)
Neutrophils Relative %: 40 %
Platelets: 128 10*3/uL — ABNORMAL LOW (ref 150–400)
RBC: 3.53 MIL/uL — ABNORMAL LOW (ref 4.22–5.81)
RDW: 15.2 % (ref 11.5–15.5)
WBC: 6 10*3/uL (ref 4.0–10.5)
nRBC: 0 % (ref 0.0–0.2)

## 2019-01-04 LAB — ETHANOL: Alcohol, Ethyl (B): 96 mg/dL — ABNORMAL HIGH (ref <10)

## 2019-01-04 MED ORDER — LORAZEPAM 1 MG PO TABS
0.0000 mg | ORAL_TABLET | Freq: Four times a day (QID) | ORAL | Status: DC
  Administered 2019-01-04: 03:00:00 1 mg via ORAL
  Filled 2019-01-04: qty 1

## 2019-01-04 MED ORDER — LORAZEPAM 1 MG PO TABS: 0.0000 mg | ORAL_TABLET | Freq: Two times a day (BID) | ORAL | Status: DC

## 2019-01-04 MED ORDER — VITAMIN B-1 100 MG PO TABS: 100.0000 mg | ORAL_TABLET | Freq: Every day | ORAL | Status: DC

## 2019-01-04 MED ORDER — LORAZEPAM 2 MG/ML IJ SOLN: 0.0000 mg | Freq: Two times a day (BID) | INTRAMUSCULAR | Status: DC

## 2019-01-04 MED ORDER — THIAMINE HCL 100 MG/ML IJ SOLN: 100.0000 mg | Freq: Every day | INTRAMUSCULAR | Status: DC

## 2019-01-04 MED ORDER — SODIUM CHLORIDE 0.9 % IV BOLUS
1000.0000 mL | Freq: Once | INTRAVENOUS | Status: AC
  Administered 2019-01-04: 1000 mL via INTRAVENOUS

## 2019-01-04 MED ORDER — LORAZEPAM 2 MG/ML IJ SOLN: 0.0000 mg | Freq: Four times a day (QID) | INTRAMUSCULAR | Status: DC

## 2019-01-04 NOTE — ED Notes (Signed)
Patient transported to X-ray 

## 2019-01-04 NOTE — Discharge Planning (Signed)
[facility] met with pt at bedside.  Pt VERY concerned with reactivating ATM card and leaving.  Pt familiar with Museum/gallery curator and [employer].  EDCM determined with pt that best option for medical care will be [facility] as he does not have a phone or stable housing and the [facility] will accommodate drop-in appointments.

## 2019-01-04 NOTE — ED Provider Notes (Addendum)
[facility] EMERGENCY DEPARTMENT Provider Note   CSN: [id number] Arrival date & time: 01/03/19  2130     History   Chief Complaint Chief Complaint  Patient presents with  . Flank Pain  . Alcohol Intoxication    HPI Luis Wu is a 58 y.o. male.     Patient presents to the ED with a chief complaint of alcohol intoxication.  He also complains of left rib pain.  He states he had a fall a few months ago.  He was seen here 2 days ago, and requested to talk with social work and case management regarding resources for outpatient follow-up to have his PEG tube removed.  He ultimately left prior to this evaluation, and was signed out AMA.  He returns tonight stating that he is remorseful for having left, and states that he would like to have this evaluation done because he is homeless, cannot contact his family, and has no follow-up in [place].  He denies any new symptoms.  The history is provided by the patient. No language interpreter was used.    History reviewed. No pertinent past medical history.  Patient Active Problem List   Diagnosis Date Noted  . Alcohol intoxication ([place]) 01/01/2019  . Acidosis, metabolic 32/95/1884  . Alcohol withdrawal ([place]) 01/01/2019  . Hypokalemia 01/01/2019  . Alcohol use disorder, severe, dependence ([place])   . PEG (percutaneous endoscopic gastrostomy) status ([place])     History reviewed. No pertinent surgical history.      Home Medications    Prior to Admission medications   Not on File    Family History No family history on file.  Social History Social History   Tobacco Use  . Smoking status: Not on file  Substance Use Topics  . Alcohol use: Not on file  . Drug use: Not on file     Allergies   Patient has no known allergies.   Review of Systems Review of Systems  All other systems reviewed and are negative.    Physical Exam Updated Vital Signs BP (!) 153/92   Pulse 94   Temp 98.4 F (36.9 C)  (Oral)   Resp 20   SpO2 93%   Physical Exam Vitals signs and nursing note reviewed.  Constitutional:      Appearance: He is well-developed.  HENT:     Head: Normocephalic and atraumatic.  Eyes:     Conjunctiva/sclera: Conjunctivae normal.  Neck:     Musculoskeletal: Neck supple.  Cardiovascular:     Rate and Rhythm: Normal rate and regular rhythm.     Heart sounds: No murmur.  Pulmonary:     Effort: Pulmonary effort is normal. No respiratory distress.     Breath sounds: Normal breath sounds.  Abdominal:     Palpations: Abdomen is soft.     Tenderness: There is no abdominal tenderness.     Comments: g-tube in place, no sign of infection  Musculoskeletal: Normal range of motion.  Skin:    General: Skin is warm and dry.  Neurological:     Mental Status: He is alert and oriented to person, place, and time.  Psychiatric:        Mood and Affect: Mood normal.        Behavior: Behavior normal.      ED Treatments / Results  Labs (all labs ordered are listed, but only abnormal results are displayed) Labs Reviewed  CBC WITH DIFFERENTIAL/PLATELET - Abnormal; Notable for the following components:  Result Value   RBC 3.53 (*)    Hemoglobin 10.3 (*)    HCT 32.5 (*)    Platelets 128 (*)    Eosinophils Absolute 0.9 (*)    All other components within normal limits  COMPREHENSIVE METABOLIC PANEL - Abnormal; Notable for the following components:   CO2 18 (*)    Creatinine, Ser 1.51 (*)    Calcium 8.6 (*)    Total Protein 9.0 (*)    AST 149 (*)    ALT 58 (*)    Alkaline Phosphatase 156 (*)    GFR calc non Af Amer 51 (*)    GFR calc Af Amer 59 (*)    All other components within normal limits  ETHANOL - Abnormal; Notable for the following components:   Alcohol, Ethyl (B) 96 (*)    All other components within normal limits    EKG None  Radiology Ct Renal Stone Study  Result Date: 01/04/2019 CLINICAL DATA:  Left-sided flank pain. EXAM: CT ABDOMEN AND PELVIS WITHOUT  CONTRAST TECHNIQUE: Multidetector CT imaging of the abdomen and pelvis was performed following the standard protocol without IV contrast. COMPARISON:  None. FINDINGS: Lower chest: There is scarring versus atelectasis at the lung bases.The heart size is normal. Hepatobiliary: There is a patent steatosis. There is a nodular contour of the liver consistent with cirrhosis. Normal gallbladder.There is no biliary ductal dilation. Pancreas: Normal contours without ductal dilatation. No peripancreatic fluid collection. Spleen: No splenic laceration or hematoma. Adrenals/Urinary Tract: --Adrenal glands: No adrenal hemorrhage. --Right kidney/ureter: No hydronephrosis or perinephric hematoma. --Left kidney/ureter: No hydronephrosis or perinephric hematoma. --Urinary bladder: Unremarkable. Stomach/Bowel: --Stomach/Duodenum: There is a well-positioned gastrojejunostomy tube in place. The tip of the catheter terminates in the proximal jejunum. --Small bowel: No dilatation or inflammation. --Colon: No focal abnormality. --Appendix: Normal. Vascular/Lymphatic: Atherosclerotic calcification is present within the non-aneurysmal abdominal aorta, without hemodynamically significant stenosis. --No retroperitoneal lymphadenopathy. --No mesenteric lymphadenopathy. --No pelvic or inguinal lymphadenopathy. Reproductive: Unremarkable Other: No ascites or free air. The abdominal wall is normal. Musculoskeletal. There are subacute to chronic rib fractures involving the posterior eighth through tenth ribs on the left. There is an age-indeterminate compression fracture of the L4 vertebral body. IMPRESSION: 1. There are multiple subacute to chronic appearing posterior left-sided rib fractures as detailed above. 2. Age-indeterminate compression fracture of the L4 vertebral body. 3. Hepatic steatosis with underlying cirrhosis. 4. Well-positioned gastrojejunostomy tube. 5. Scarring versus atelectasis of the left lower lobe. Electronically Signed    By: Katherine Mantle M.D.   On: 01/04/2019 03:00    Procedures Procedures (including critical care time)  Medications Ordered in ED Medications  LORazepam (ATIVAN) injection 0-4 mg ( Intravenous See Alternative 01/04/19 0311)    Or  LORazepam (ATIVAN) tablet 0-4 mg (1 mg Oral Given 01/04/19 0311)  LORazepam (ATIVAN) injection 0-4 mg (has no administration in time range)    Or  LORazepam (ATIVAN) tablet 0-4 mg (has no administration in time range)  thiamine (VITAMIN B-1) tablet 100 mg (has no administration in time range)    Or  thiamine (B-1) injection 100 mg (has no administration in time range)  sodium chloride 0.9 % bolus 1,000 mL (has no administration in time range)     Initial Impression / Assessment and Plan / ED Course  I have reviewed the triage vital signs and the nursing notes.  Pertinent labs & imaging results that were available during my care of the patient were reviewed by me and considered in my medical  decision making (see chart for details).        Patient checks in again tonight intoxicated, but is alert and oriented.  States that he needs help getting follow-up to have his g-tube removed and is also interested in getting resources for alcohol abuse/detox.  He denies SI.    Will consult case management/social work to see if help can be given regarding follow-up.  Will consult TTS to see about options for alcohol detox.  Labs notable for increased creatinine.  Will give a liter of fluid.  I have advised the patient that he will need to drink more water and have his kidney function rechecked by a doctor in a week.  CT shows no obstructive uropathy.  Subacute rib fractures seen, this explains his flain/rib pain.  Otherwise stable and awaiting consults from CM/TTS.  Discussed with oncoming team, who is aware of patient.  Final Clinical Impressions(s) / ED Diagnoses   Final diagnoses:  Alcoholic intoxication without complication (HCC)  Alcohol abuse   Elevated serum creatinine    ED Discharge Orders    None       Roxy HorsemanBrowning, Desira Alessandrini, PA-C 01/04/19 0624    Roxy HorsemanBrowning, Vernida Mcnicholas, PA-C 01/04/19 [id number]    Dione BoozeGlick, David, MD 01/04/19 [id number]

## 2019-01-04 NOTE — ED Notes (Signed)
Patient transported to CT 

## 2019-01-04 NOTE — ED Notes (Signed)
Breakfast Ordered 

## 2019-01-04 NOTE — Discharge Instructions (Addendum)
You need to drink more water.  Please have your kidney function rechecked in 1 week.

## 2019-01-06 ENCOUNTER — Emergency Department (HOSPITAL_COMMUNITY)
Admission: EM | Admit: 2019-01-06 | Discharge: 2019-01-07 | Disposition: A | Discharge status: Home / Self Care | Payer: Medicaid Other | Source: Home / Self Care | Attending: Emergency Medicine | Admitting: Emergency Medicine

## 2019-01-06 ENCOUNTER — Emergency Department (HOSPITAL_COMMUNITY)
Admission: EM | Admit: 2019-01-06 | Discharge: 2019-01-06 | Disposition: A | Discharge status: Home / Self Care | Payer: Medicaid Other | Attending: Emergency Medicine | Admitting: Emergency Medicine

## 2019-01-06 ENCOUNTER — Other Ambulatory Visit: Payer: Self-pay

## 2019-01-06 ENCOUNTER — Encounter (HOSPITAL_COMMUNITY): Payer: Self-pay | Admitting: Obstetrics and Gynecology

## 2019-01-06 ENCOUNTER — Encounter (HOSPITAL_COMMUNITY): Payer: Self-pay | Admitting: Emergency Medicine

## 2019-01-06 DIAGNOSIS — F1010 Alcohol abuse, uncomplicated: Secondary | ICD-10-CM | POA: Diagnosis not present

## 2019-01-06 DIAGNOSIS — Z5321 Procedure and treatment not carried out due to patient leaving prior to being seen by health care provider: Secondary | ICD-10-CM | POA: Diagnosis not present

## 2019-01-06 DIAGNOSIS — T85848A Pain due to other internal prosthetic devices, implants and grafts, initial encounter: Secondary | ICD-10-CM | POA: Insufficient documentation

## 2019-01-06 DIAGNOSIS — R531 Weakness: Secondary | ICD-10-CM

## 2019-01-06 DIAGNOSIS — F17210 Nicotine dependence, cigarettes, uncomplicated: Secondary | ICD-10-CM | POA: Insufficient documentation

## 2019-01-06 DIAGNOSIS — R109 Unspecified abdominal pain: Secondary | ICD-10-CM | POA: Diagnosis present

## 2019-01-06 DIAGNOSIS — F10920 Alcohol use, unspecified with intoxication, uncomplicated: Secondary | ICD-10-CM | POA: Diagnosis not present

## 2019-01-06 DIAGNOSIS — Y828 Other medical devices associated with adverse incidents: Secondary | ICD-10-CM | POA: Insufficient documentation

## 2019-01-06 HISTORY — DX: Alcohol abuse, uncomplicated: F10.10

## 2019-01-06 HISTORY — DX: Unspecified coma: R40.20

## 2019-01-06 HISTORY — DX: Gastrostomy malfunction: K94.23

## 2019-01-06 LAB — CBC WITH DIFFERENTIAL/PLATELET
Abs Immature Granulocytes: 0.01 10*3/uL (ref 0.00–0.07)
Basophils Absolute: 0.1 10*3/uL (ref 0.0–0.1)
Basophils Relative: 1 %
Eosinophils Absolute: 0.6 10*3/uL — ABNORMAL HIGH (ref 0.0–0.5)
Eosinophils Relative: 11 %
HCT: 30.2 % — ABNORMAL LOW (ref 39.0–52.0)
Hemoglobin: 9.6 g/dL — ABNORMAL LOW (ref 13.0–17.0)
Immature Granulocytes: 0 %
Lymphocytes Relative: 36 %
Lymphs Abs: 1.9 10*3/uL (ref 0.7–4.0)
MCH: 29.3 pg (ref 26.0–34.0)
MCHC: 31.8 g/dL (ref 30.0–36.0)
MCV: 92.1 fL (ref 80.0–100.0)
Monocytes Absolute: 0.6 10*3/uL (ref 0.1–1.0)
Monocytes Relative: 12 %
Neutro Abs: 2.1 10*3/uL (ref 1.7–7.7)
Neutrophils Relative %: 40 %
Platelets: 89 10*3/uL — ABNORMAL LOW (ref 150–400)
RBC: 3.28 MIL/uL — ABNORMAL LOW (ref 4.22–5.81)
RDW: 15.6 % — ABNORMAL HIGH (ref 11.5–15.5)
WBC: 5.3 10*3/uL (ref 4.0–10.5)
nRBC: 0 % (ref 0.0–0.2)

## 2019-01-06 LAB — URINALYSIS, ROUTINE W REFLEX MICROSCOPIC
Bilirubin Urine: NEGATIVE
Glucose, UA: NEGATIVE mg/dL
Hgb urine dipstick: NEGATIVE
Ketones, ur: NEGATIVE mg/dL
Leukocytes,Ua: NEGATIVE
Nitrite: NEGATIVE
Protein, ur: NEGATIVE mg/dL
Specific Gravity, Urine: 1.002 — ABNORMAL LOW (ref 1.005–1.030)
pH: 6 (ref 5.0–8.0)

## 2019-01-06 LAB — COMPREHENSIVE METABOLIC PANEL
ALT: 48 U/L — ABNORMAL HIGH (ref 0–44)
AST: 113 U/L — ABNORMAL HIGH (ref 15–41)
Albumin: 3.1 g/dL — ABNORMAL LOW (ref 3.5–5.0)
Alkaline Phosphatase: 149 U/L — ABNORMAL HIGH (ref 38–126)
Anion gap: 10 (ref 5–15)
BUN: <5 mg/dL — ABNORMAL LOW (ref 6–20)
CO2: 19 mmol/L — ABNORMAL LOW (ref 22–32)
Calcium: 8.1 mg/dL — ABNORMAL LOW (ref 8.9–10.3)
Chloride: 108 mmol/L (ref 98–111)
Creatinine, Ser: 1.19 mg/dL (ref 0.61–1.24)
GFR calc Af Amer: >60 mL/min (ref >60)
GFR calc non Af Amer: >60 mL/min (ref >60)
Glucose, Bld: 82 mg/dL (ref 70–99)
Potassium: 3.8 mmol/L (ref 3.5–5.1)
Sodium: 137 mmol/L (ref 135–145)
Total Bilirubin: 0.8 mg/dL (ref 0.3–1.2)
Total Protein: 8.2 g/dL — ABNORMAL HIGH (ref 6.5–8.1)

## 2019-01-06 NOTE — ED Triage Notes (Signed)
Patient reports generalized weakjness/legs weak for 2 weeks , patient also requesting PEG tube removal.

## 2019-01-06 NOTE — ED Triage Notes (Signed)
Pt reports to the ED intoxicated and reporting pain in his abdomen where his peg tube is. Patient admits to drinking alcohol today. Patient reports he needs to see a counselor or Education officer, museum for his drinking.

## 2019-01-07 ENCOUNTER — Emergency Department (HOSPITAL_COMMUNITY)
Admission: EM | Admit: 2019-01-07 | Discharge: 2019-01-07 | Disposition: A | Discharge status: Home / Self Care | Payer: Medicaid Other | Attending: Emergency Medicine | Admitting: Emergency Medicine

## 2019-01-07 ENCOUNTER — Encounter (HOSPITAL_COMMUNITY): Payer: Self-pay | Admitting: Emergency Medicine

## 2019-01-07 ENCOUNTER — Emergency Department (HOSPITAL_COMMUNITY): Payer: Medicaid Other

## 2019-01-07 ENCOUNTER — Other Ambulatory Visit: Payer: Self-pay

## 2019-01-07 DIAGNOSIS — F17210 Nicotine dependence, cigarettes, uncomplicated: Secondary | ICD-10-CM | POA: Insufficient documentation

## 2019-01-07 DIAGNOSIS — F10920 Alcohol use, unspecified with intoxication, uncomplicated: Secondary | ICD-10-CM | POA: Insufficient documentation

## 2019-01-07 DIAGNOSIS — Z590 Homelessness: Secondary | ICD-10-CM | POA: Insufficient documentation

## 2019-01-07 DIAGNOSIS — Z931 Gastrostomy status: Secondary | ICD-10-CM | POA: Diagnosis not present

## 2019-01-07 LAB — POC OCCULT BLOOD, ED: Fecal Occult Bld: NEGATIVE

## 2019-01-07 MED ORDER — ACETAMINOPHEN 325 MG PO TABS: 650.0000 mg | ORAL_TABLET | Freq: Once | ORAL | Status: DC

## 2019-01-07 NOTE — Discharge Instructions (Signed)
Please go directly to the [facility] they can help with medical care and eventual removal of your PEG tube, as well as help with homelessness resources and treatment for alcohol abuse.  Return to the ED for any new or worsening symptoms.

## 2019-01-07 NOTE — ED Notes (Signed)
PA at bedside to complete rectal exam for POC occult. This nurse at bedside

## 2019-01-07 NOTE — ED Triage Notes (Signed)
Pt presents via EMS from with c/o abdominal pain and reports that his g-tube is bothering him. Pt reports the g-tube is 2 months old, EMS reports that he does have pus around the tube. Pt is also heavily intoxicated. Pt is able to answer all questions per EMS. EMS found pt in a yard attempting to defecate on a tree.

## 2019-01-07 NOTE — ED Notes (Signed)
Pt d/c home per MD order  , pt uninterested in discharge summary, irritable with nursing staff. Ambulatory off unit. No s/s of acute distress noted.

## 2019-01-07 NOTE — ED Notes (Signed)
Pt given ham sandwich and water. Tolerated food and drink well.

## 2019-01-07 NOTE — ED Provider Notes (Signed)
[facility] EMERGENCY DEPARTMENT Provider Note   CSN: [id number] Arrival date & time: 01/06/19  7829     History   Chief Complaint Chief Complaint  Patient presents with  . Generalized weakness/PEG tube removal    HPI Rieley Ahlers is a 58 y.o. male.     Jaysean Pavich is a 58 y.o. male with a history of alcohol abuse, PEG tube, coma, who presents to the ED complaining of generalized weakness and wanting his PEG tube removed.  Patient reports that he was in [place] when he had a fall with hospitalization where he eventually was intubated, had a trach placed and a PEG tube placed.  Since then he has moved down to [place] where his family is located, but he is currently homeless.  He has been seen in the ED multiple times over the past week and a half for similar complaints.  Patient reports that he has had weakness over the past month since his fall that is not changed or worsened.  He reports he is ambulatory with a limp due to some chronic knee issues.  Patient reports a history of chronic alcohol abuse, last drink yesterday prior to presenting to the ED.  Patient denies any fevers, reports some chronic pain over the side of the chest where he fell and had rib fractures which is not new or worsened.  There are some intermittent aching of the abdomen around his PEG tube but he has not had any redness swelling or drainage around the PEG tube.  Is not currently using this for nutrition.  Has come to the ED multiple times requesting removal of this but has not followed up.  He denies any focal weakness, numbness.  Reports history of falls but no recent falls or head injury.  Requesting social work consult for help getting in contact with his family, also expressing interest in alcohol treatment, last drink just prior to arrival.     Past Medical History:  Diagnosis Date  . Alcohol abuse   . Coma (HCC)   . PEG tube malfunction [facility])     Patient Active Problem  List   Diagnosis Date Noted  . Alcohol intoxication (HCC) 01/01/2019  . Acidosis, metabolic 01/01/2019  . Alcohol withdrawal (HCC) 01/01/2019  . Hypokalemia 01/01/2019  . Alcohol use disorder, severe, dependence (HCC)   . PEG (percutaneous endoscopic gastrostomy) status (HCC)     History reviewed. No pertinent surgical history.      Home Medications    Prior to Admission medications   Not on File    Family History No family history on file.  Social History Social History   Tobacco Use  . Smoking status: Current Every Day Smoker    Packs/day: 0.50    Types: Cigarettes  Substance Use Topics  . Alcohol use: Yes    Alcohol/week: 8.0 standard drinks    Types: 8 Cans of beer per week  . Drug use: Not Currently     Allergies   Patient has no known allergies.   Review of Systems Review of Systems  Constitutional: Negative for chills and fever.  HENT: Negative.   Respiratory: Negative for cough and shortness of breath.   Cardiovascular: Negative for chest pain.  Gastrointestinal: Positive for abdominal pain. Negative for diarrhea, nausea and vomiting.  Genitourinary: Negative for dysuria.  Musculoskeletal: Positive for myalgias. Negative for arthralgias and back pain.  Skin: Negative for color change and rash.  Neurological: Positive for weakness (Generalized). Negative for  dizziness, syncope, light-headedness, numbness and headaches.     Physical Exam Updated Vital Signs BP (!) 141/93 (BP Location: Right Arm)   Pulse 84   Temp 98 F (36.7 C) (Oral)   Resp 20   SpO2 100%   Physical Exam Vitals signs and nursing note reviewed. Exam conducted with a chaperone present.  Constitutional:      General: He is not in acute distress.    Appearance: Normal appearance. He is well-developed and normal weight. He is not ill-appearing or diaphoretic.  HENT:     Head: Normocephalic and atraumatic.  Eyes:     General:        Right eye: No discharge.        Left eye:  No discharge.     Pupils: Pupils are equal, round, and reactive to light.  Neck:     Musculoskeletal: Neck supple.  Cardiovascular:     Rate and Rhythm: Normal rate and regular rhythm.     Heart sounds: Normal heart sounds. No murmur. No friction rub. No gallop.   Pulmonary:     Effort: Pulmonary effort is normal. No respiratory distress.     Breath sounds: Normal breath sounds. No wheezing or rales.     Comments: Respirations equal and unlabored, patient able to speak in full sentences, lungs clear to auscultation bilaterally Abdominal:     General: Bowel sounds are normal. There is no distension.     Palpations: Abdomen is soft. There is no mass.     Tenderness: There is no abdominal tenderness. There is no guarding.     Comments: PEG tube present, without surrounding erythema or signs of infection, abdomen is soft, non-distended, bowel sounds present throughout, patient endorses some mild tenderness but there is no guarding or peritoneal signs.  Genitourinary:    Comments: Chaperone present during rectal exam. No external hemorrhoids or fissures noted. Soft brown stool noted in the rectal vault, no bright red blood or melena.  Normal rectal tone. Musculoskeletal:        General: No deformity.  Skin:    General: Skin is warm and dry.     Capillary Refill: Capillary refill takes less than 2 seconds.  Neurological:     Mental Status: He is alert.     Coordination: Coordination normal.     Comments: Speech is clear, able to follow commands CN III-XII intact Normal strength in upper and lower extremities bilaterally including dorsiflexion and plantar flexion, strong and equal grip strength Sensation normal to light and sharp touch Moves extremities without ataxia, coordination intact, no tremors   Psychiatric:        Mood and Affect: Mood normal.        Behavior: Behavior normal.      ED Treatments / Results  Labs (all labs ordered are listed, but only abnormal results are  displayed) Labs Reviewed  CBC WITH DIFFERENTIAL/PLATELET - Abnormal; Notable for the following components:      Result Value   RBC 3.28 (*)    Hemoglobin 9.6 (*)    HCT 30.2 (*)    RDW 15.6 (*)    Platelets 89 (*)    Eosinophils Absolute 0.6 (*)    All other components within normal limits  COMPREHENSIVE METABOLIC PANEL - Abnormal; Notable for the following components:   CO2 19 (*)    BUN <5 (*)    Calcium 8.1 (*)    Total Protein 8.2 (*)    Albumin 3.1 (*)  AST 113 (*)    ALT 48 (*)    Alkaline Phosphatase 149 (*)    All other components within normal limits  URINALYSIS, ROUTINE W REFLEX MICROSCOPIC - Abnormal; Notable for the following components:   Specific Gravity, Urine 1.002 (*)    All other components within normal limits  POC OCCULT BLOOD, ED    EKG EKG Interpretation  Date/Time:  Thursday January 07 2019 08:14:25 EST Ventricular Rate:  84 PR Interval:    QRS Duration: 98 QT Interval:  396 QTC Calculation: 469 R Axis:   36 Text Interpretation: Sinus rhythm No significant change was found Confirmed by Glynn Octave [phone]) on 01/07/2019 8:23:43 AM   Radiology Dg Abdomen Acute W/chest  Result Date: 01/07/2019 CLINICAL DATA:  Pain around PEG tube EXAM: DG ABDOMEN ACUTE W/ 1V CHEST COMPARISON:  None. FINDINGS: Percutaneous gastrojejunostomy tube is present. Bowel gas pattern is unremarkable. There is no evidence of free air. Left basilar atelectasis. IMPRESSION: Normal bowel gas pattern.  No free air. Electronically Signed   By: Guadlupe Spanish M.D.   On: 01/07/2019 08:14    Procedures Procedures (including critical care time)  Medications Ordered in ED Medications  acetaminophen (TYLENOL) tablet 650 mg (has no administration in time range)     Initial Impression / Assessment and Plan / ED Course  I have reviewed the triage vital signs and the nursing notes.  Pertinent labs & imaging results that were available during my care of the patient were  reviewed by me and considered in my medical decision making (see chart for details).  58 year old male who is homeless presenting reporting generalized weakness over the past month, and requesting PEG tube removal.  Patient had a fall in [place] where he was intubated, ultimately trached and had a PEG tube put in place.  Janina Mayo has since been removed and healed, PEG tube without erythema or purulent drainage, patient endorses mild tenderness but there is no focal area of guarding.  Acute abdominal x-ray with no abnormal bowel gas pattern.  Patient not having vomiting or diarrhea.  Denies blood in the stool.  No focal weakness or numbness, normal neurologic exam.  History of chronic alcohol use, has been seen in the emergency department multiple times over the past week and a half for similar symptoms.  EKG with some artifact but otherwise no significant changes.  Lab work ordered from triage with no leukocytosis, hemoglobin has been downtrending from 11.4-9.6 gradually over the past week with multiple visits, but Hemoccult is negative and patient denies any bleeding symptoms, I suspect this is in the setting of patient's chronic alcohol abuse.  No significant electrolyte derangements requiring intervention, normal renal function, mildly elevated LFTs are not significantly changed from baseline.  Urinalysis without hematuria or signs of infection.  Case management came and spoke with patient they have seen the patient previously and have recommended that he go to the [facility] for further medical care, these recommendations are restated today I have provided patient with address and phone number for [facility].  He expresses agreement and states that he will go there today.   At this time there does not appear to be any evidence of an acute emergency medical condition and the patient appears stable for discharge with appropriate outpatient follow up .Diagnosis was discussed with patient who verbalizes understanding and is  agreeable to discharge. Pt case discussed with Dr. Manus Gunning who agrees with my plan.    Final Clinical Impressions(s) / ED Diagnoses   Final  diagnoses:  Generalized weakness  Pain around percutaneous endoscopic gastrostomy (PEG) tube site, initial encounter  Alcohol abuse    ED Discharge Orders    None       Dartha LodgeFord, Pratt Bress N, [place]PA-C 01/07/19 [id number]    Glynn Octaveancour, Stephen, MD 01/07/19 1709

## 2019-01-07 NOTE — ED Provider Notes (Addendum)
[place] DEPT Provider Note   CSN: [id number] Arrival date & time: 01/07/19  4098     History   Chief Complaint Chief Complaint  Patient presents with  . Abdominal Pain  . Alcohol Intoxication    HPI Luis Wu is a 58 y.o. male with history of alcohol abuse, homelessness, PEG tube brought to the ER by EMS after he was found trying to defecate next to a tree.  Per EMS patient is complaining of abdominal pain around his PEG tube.  EMS concerned about alcohol intoxication.  Per triage note he is fully oriented and answering all questions per EMS and ambulatory.  Patient reports he is here for "the same".  He elaborates that he is having discomfort around the PEG tube and associated swelling.  He would like this to be removed.  States he does not use it and is eating normally by mouth without issues.  Per chart review, patient was discharged from the ER earlier this morning.  At that time he presented for abdominal discomfort and was requesting his PEG tube be removed.  He spoke to case management who instructed him to contact [facility] for ongoing management of his medical issues.  States he has not contacted the [facility] because he does not "trust them" because there are too many people there.  He has drank several 40s of beer and denies any other illicit drug use.  He denies headache, chest pain or shortness of breath, vomiting or diarrhea.  He is asking for a blanket and food. No modifying factors.      HPI  Past Medical History:  Diagnosis Date  . Alcohol abuse   . Coma ([place])   . PEG tube malfunction [facility])     Patient Active Problem List   Diagnosis Date Noted  . Alcohol intoxication ([place]) 01/01/2019  . Acidosis, metabolic 11/91/4782  . Alcohol withdrawal ([place]) 01/01/2019  . Hypokalemia 01/01/2019  . Alcohol use disorder, severe, dependence ([place])   . PEG (percutaneous endoscopic gastrostomy) status ([place])     History reviewed. No pertinent surgical  history.      Home Medications    Prior to Admission medications   Not on File    Family History History reviewed. No pertinent family history.  Social History Social History   Tobacco Use  . Smoking status: Current Every Day Smoker    Packs/day: 0.50    Types: Cigarettes  . Smokeless tobacco: Never Used  Substance Use Topics  . Alcohol use: Yes    Alcohol/week: 8.0 standard drinks    Types: 8 Cans of beer per week  . Drug use: Not Currently     Allergies   Patient has no known allergies.   Review of Systems Review of Systems  Gastrointestinal: Positive for abdominal pain.  All other systems reviewed and are negative.    Physical Exam Updated Vital Signs BP 131/80 (BP Location: Right Arm)   Pulse 77   Temp (!) 97.5 F (36.4 C) (Oral)   Resp 18   SpO2 95%   Physical Exam Vitals signs and nursing note reviewed.  Constitutional:      General: He is not in acute distress.    Appearance: He is well-developed.     Comments: Disheveled, poor hygiene.  Asleep but easily arousable to voice.Marland Kitchen  HENT:     Head: Normocephalic and atraumatic.     Comments: No tenderness or signs of trauma to the scalp or face bones.  Right Ear: External ear normal.     Left Ear: External ear normal.     Nose: Nose normal.  Eyes:     Conjunctiva/sclera: Conjunctivae normal.  Neck:     Musculoskeletal: Normal range of motion and neck supple.  Cardiovascular:     Rate and Rhythm: Normal rate and regular rhythm.     Heart sounds: Normal heart sounds.  Pulmonary:     Effort: Pulmonary effort is normal.     Breath sounds: Normal breath sounds.  Abdominal:     General: Abdomen is flat.     Palpations: Abdomen is soft.     Tenderness: There is no abdominal tenderness.     Comments: PEG tube in the left mid abdomen, minimal crusting around the skin.  No obvious discomfort with palpation around the PEG tube, edema, erythema, drainage.  Musculoskeletal: Normal range of motion.         General: No deformity.  Skin:    General: Skin is warm and dry.     Capillary Refill: Capillary refill takes less than 2 seconds.  Neurological:     Mental Status: He is alert and oriented to person, place, and time.     Comments:  Speech is slightly slurred.  Strong odor of EtOH noted.  Oriented to self, place, year.  Slightly somnolent but is able to carry a conversation.  Follows commands and able to sit up and ambulate in the room, slight antalgia noted patient states he has chronic knee pain.  No significant ataxia.  Strength and sensation intact in upper and lower extremities.  Psychiatric:        Behavior: Behavior normal.        Thought Content: Thought content normal.        Judgment: Judgment normal.      ED Treatments / Results  Labs (all labs ordered are listed, but only abnormal results are displayed) Labs Reviewed - No data to display  EKG None  Radiology Dg Abdomen Acute W/chest  Result Date: 01/07/2019 CLINICAL DATA:  Pain around PEG tube EXAM: DG ABDOMEN ACUTE W/ 1V CHEST COMPARISON:  None. FINDINGS: Percutaneous gastrojejunostomy tube is present. Bowel gas pattern is unremarkable. There is no evidence of free air. Left basilar atelectasis. IMPRESSION: Normal bowel gas pattern.  No free air. Electronically Signed   By: Guadlupe SpanishPraneil  Patel M.D.   On: 01/07/2019 08:14    Procedures Procedures (including critical care time)  Medications Ordered in ED Medications - No data to display   Initial Impression / Assessment and Plan / ED Course  I have reviewed the triage vital signs and the nursing notes.  Pertinent labs & imaging results that were available during my care of the patient were reviewed by me and considered in my medical decision making (see chart for details).  I reviewed patient's EMR.  This is a 6 ER visit in the last 9 days.  He has presented with alcohol intoxication and chief complaints of abdominal pain requesting his back to be removed.  He was  seen and discharged from the ER earlier this morning.  Work-up was remarkable for mild anemia hemoglobin 9.6, negative Hemoccult.  LFTs slightly elevated which appear chronic.  Lipase normal.  Urinalysis was unremarkable.  Patient is intoxicated but clinically sober.  He is eating and drinking fluid and ambulatory without any difficulty.  Vital signs are normal.  He was observed in the ER and metabolized and had improvement in level of intoxication.  No clinical  decline upon reevaluation.  Vital signs remained stable.  Do not think emergent work-up is indicated tonight.  Again explained to him we are unable to remove PEG tube here.  Outpatient resources given.  Return precautions discussed.  Discussed with EDP who agrees with ER management and disposition.   Final Clinical Impressions(s) / ED Diagnoses   Final diagnoses:  Alcoholic intoxication without complication (HCC)  S/P percutaneous endoscopic gastrostomy (PEG) tube placement [facility])    ED Discharge Orders    None        Liberty Handy, PA-C 01/08/19 0005    Mancel Bale, MD 01/08/19 1132

## 2019-01-07 NOTE — ED Notes (Addendum)
Pt reports generalized weakness for the last 2 weeks. Pt reports he also wants his PEG tube removed. Pt reports he was in [place] and had a fall requiring him to be intubated with eventual trach placement. Pt reports they then placed the PEG tube for nutrition purposes. Pt reports he moved down to [place] a month ago to be close to family but now he is homeless. Pt is also requesting a social work consult as well.   Pt ambulated from the lobby to the room with a steady gait and no acute distress.

## 2019-01-07 NOTE — Discharge Instructions (Addendum)
We cannot remove PEG tubes in the ER  Make an appointment primary care doctor ([facility] or [facility]) to get referral to IR for removal

## 2019-01-07 NOTE — ED Notes (Signed)
Patient transported to X-ray 

## 2019-01-07 NOTE — ED Triage Notes (Signed)
Patient had blood done on 01/06/2019 for the same problem.

## 2019-01-07 NOTE — Discharge Planning (Signed)
EDCM met with pt at bedside regarding homeless resources.  This EDCM spoke with pt 3 days ago with same resources.  EDCM will contact Reginal Lutes, NP at [facility] to anticipate pt and assist with medical concerns and orange card application.

## 2019-01-14 ENCOUNTER — Other Ambulatory Visit: Payer: Self-pay

## 2019-01-14 ENCOUNTER — Emergency Department (HOSPITAL_COMMUNITY)
Admission: EM | Admit: 2019-01-14 | Discharge: 2019-01-15 | Disposition: A | Discharge status: Home / Self Care | Payer: Medicaid Other | Attending: Emergency Medicine | Admitting: Emergency Medicine

## 2019-01-14 DIAGNOSIS — R109 Unspecified abdominal pain: Secondary | ICD-10-CM | POA: Diagnosis present

## 2019-01-14 DIAGNOSIS — Z5321 Procedure and treatment not carried out due to patient leaving prior to being seen by health care provider: Secondary | ICD-10-CM | POA: Diagnosis not present

## 2019-01-14 LAB — ETHANOL: Alcohol, Ethyl (B): <10 mg/dL (ref <10)

## 2019-01-14 LAB — COMPREHENSIVE METABOLIC PANEL
ALT: 48 U/L — ABNORMAL HIGH (ref 0–44)
AST: 113 U/L — ABNORMAL HIGH (ref 15–41)
Albumin: 3.2 g/dL — ABNORMAL LOW (ref 3.5–5.0)
Alkaline Phosphatase: 141 U/L — ABNORMAL HIGH (ref 38–126)
Anion gap: 9 (ref 5–15)
BUN: 12 mg/dL (ref 6–20)
CO2: 22 mmol/L (ref 22–32)
Calcium: 8.3 mg/dL — ABNORMAL LOW (ref 8.9–10.3)
Chloride: 107 mmol/L (ref 98–111)
Creatinine, Ser: 1.22 mg/dL (ref 0.61–1.24)
GFR calc Af Amer: >60 mL/min (ref >60)
GFR calc non Af Amer: >60 mL/min (ref >60)
Glucose, Bld: 76 mg/dL (ref 70–99)
Potassium: 3.6 mmol/L (ref 3.5–5.1)
Sodium: 138 mmol/L (ref 135–145)
Total Bilirubin: 1.1 mg/dL (ref 0.3–1.2)
Total Protein: 8.9 g/dL — ABNORMAL HIGH (ref 6.5–8.1)

## 2019-01-14 LAB — CBC
HCT: 33.4 % — ABNORMAL LOW (ref 39.0–52.0)
Hemoglobin: 10.4 g/dL — ABNORMAL LOW (ref 13.0–17.0)
MCH: 29.1 pg (ref 26.0–34.0)
MCHC: 31.1 g/dL (ref 30.0–36.0)
MCV: 93.6 fL (ref 80.0–100.0)
Platelets: 152 10*3/uL (ref 150–400)
RBC: 3.57 MIL/uL — ABNORMAL LOW (ref 4.22–5.81)
RDW: 17.3 % — ABNORMAL HIGH (ref 11.5–15.5)
WBC: 4.7 10*3/uL (ref 4.0–10.5)
nRBC: 0 % (ref 0.0–0.2)

## 2019-01-14 LAB — URINALYSIS, ROUTINE W REFLEX MICROSCOPIC
Bilirubin Urine: NEGATIVE
Glucose, UA: NEGATIVE mg/dL
Hgb urine dipstick: NEGATIVE
Ketones, ur: NEGATIVE mg/dL
Leukocytes,Ua: NEGATIVE
Nitrite: NEGATIVE
Protein, ur: NEGATIVE mg/dL
Specific Gravity, Urine: 1.021 (ref 1.005–1.030)
pH: 6 (ref 5.0–8.0)

## 2019-01-14 LAB — LIPASE, BLOOD: Lipase: 68 U/L — ABNORMAL HIGH (ref 11–51)

## 2019-01-14 NOTE — ED Triage Notes (Signed)
Per [facility] pt c/o left abdominal pain x 1 month. Patient c/o tenderness around g-tube site. States it was supposed to be removed 2 months ago.

## 2019-01-15 ENCOUNTER — Emergency Department (HOSPITAL_COMMUNITY)
Admission: EM | Admit: 2019-01-15 | Discharge: 2019-01-16 | Disposition: A | Discharge status: Home / Self Care | Payer: Medicaid Other | Source: Home / Self Care

## 2019-01-15 ENCOUNTER — Encounter (HOSPITAL_COMMUNITY): Payer: Self-pay

## 2019-01-15 ENCOUNTER — Other Ambulatory Visit: Payer: Self-pay

## 2019-01-15 DIAGNOSIS — Z5321 Procedure and treatment not carried out due to patient leaving prior to being seen by health care provider: Secondary | ICD-10-CM | POA: Insufficient documentation

## 2019-01-15 NOTE — ED Triage Notes (Signed)
Per EMS- patient told EMS that he needs a colostomy bag. Patient reported that he has had a "few beers."

## 2019-01-15 NOTE — ED Notes (Signed)
Called [facility] for VS. No answer. 

## 2020-11-28 IMAGING — CR DG CHEST 2V
2 series · 2 of 2 positions shown · non-contrast
Comparison: None.

CLINICAL DATA: Chest pain

EXAM:
CHEST - 2 VIEW

[chest pa]
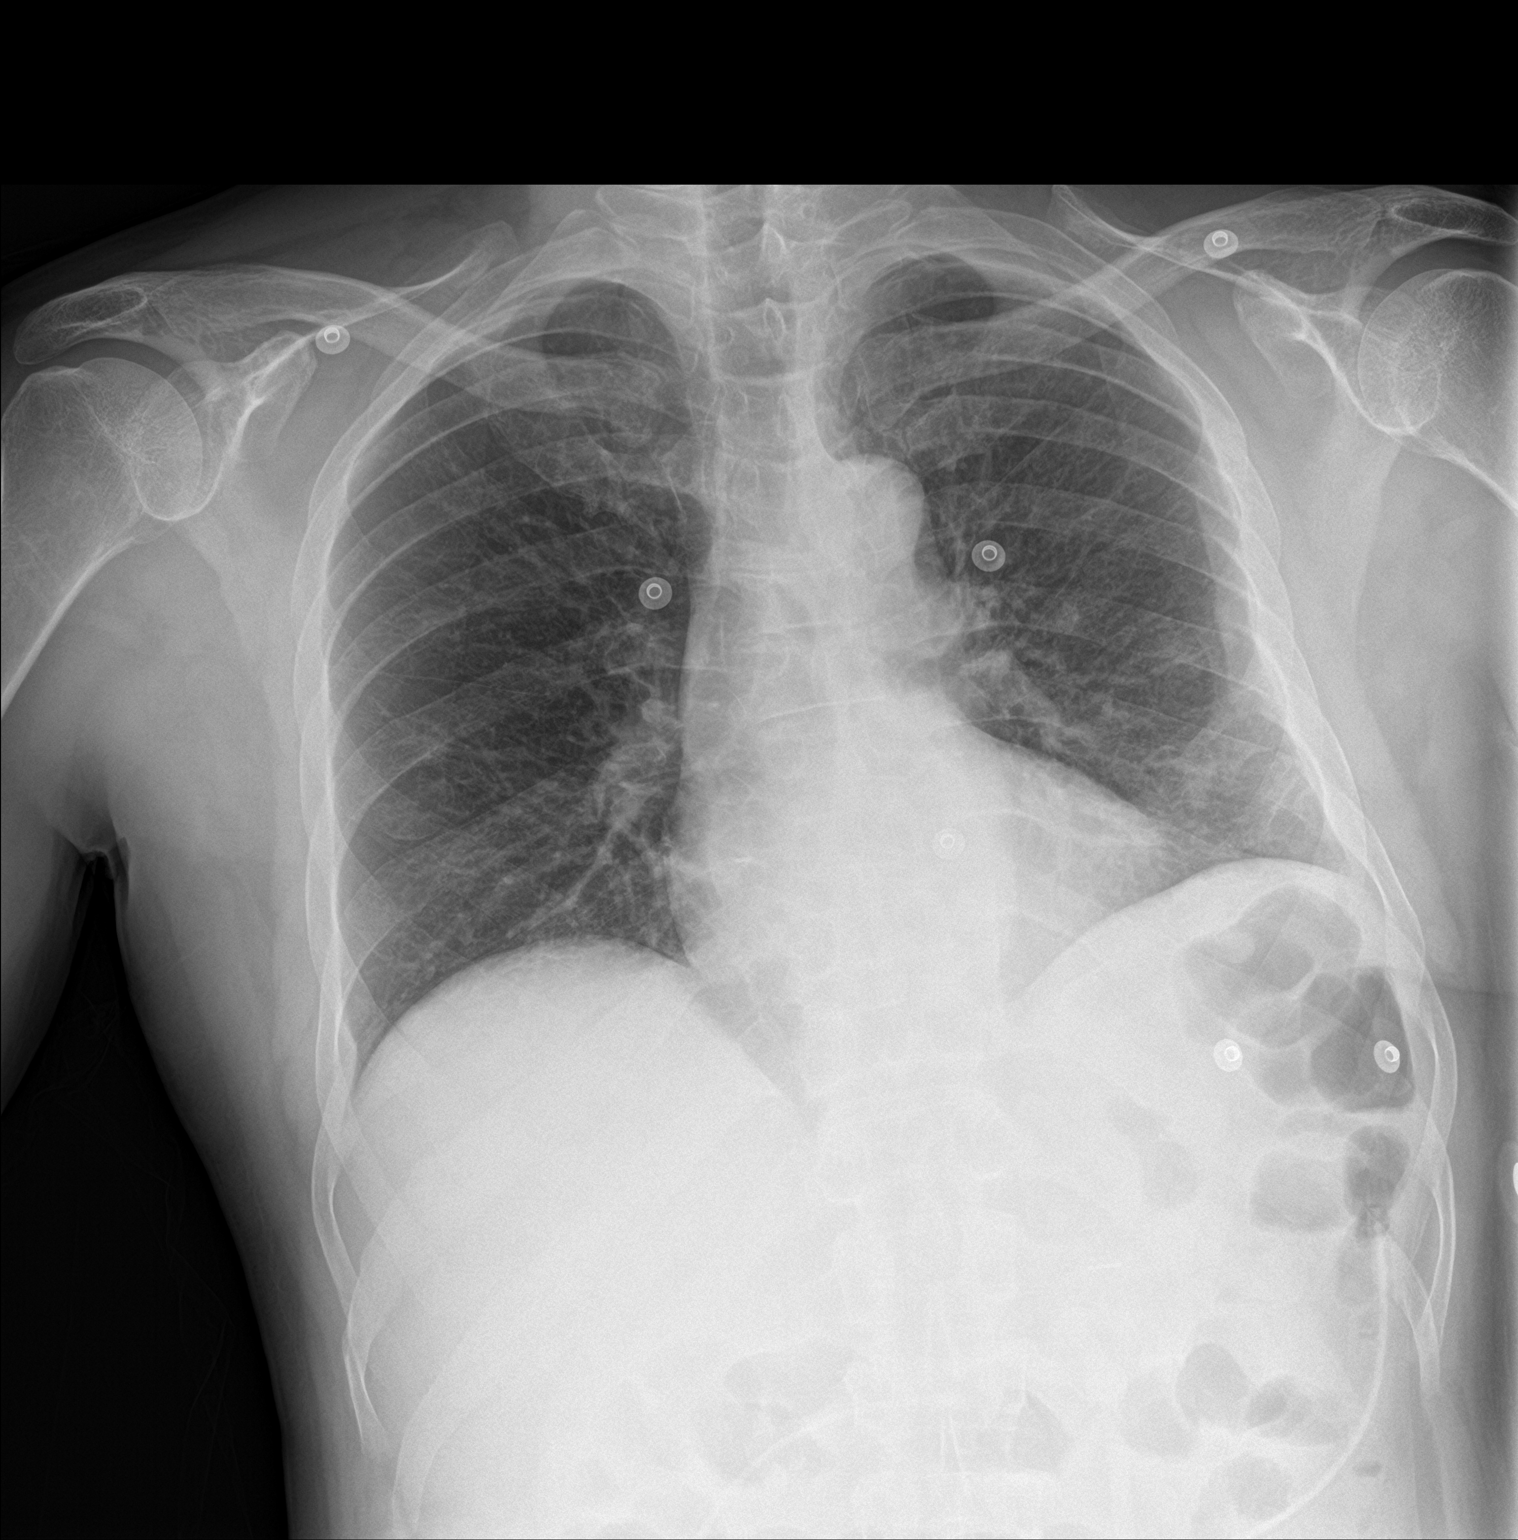

[chest lat]
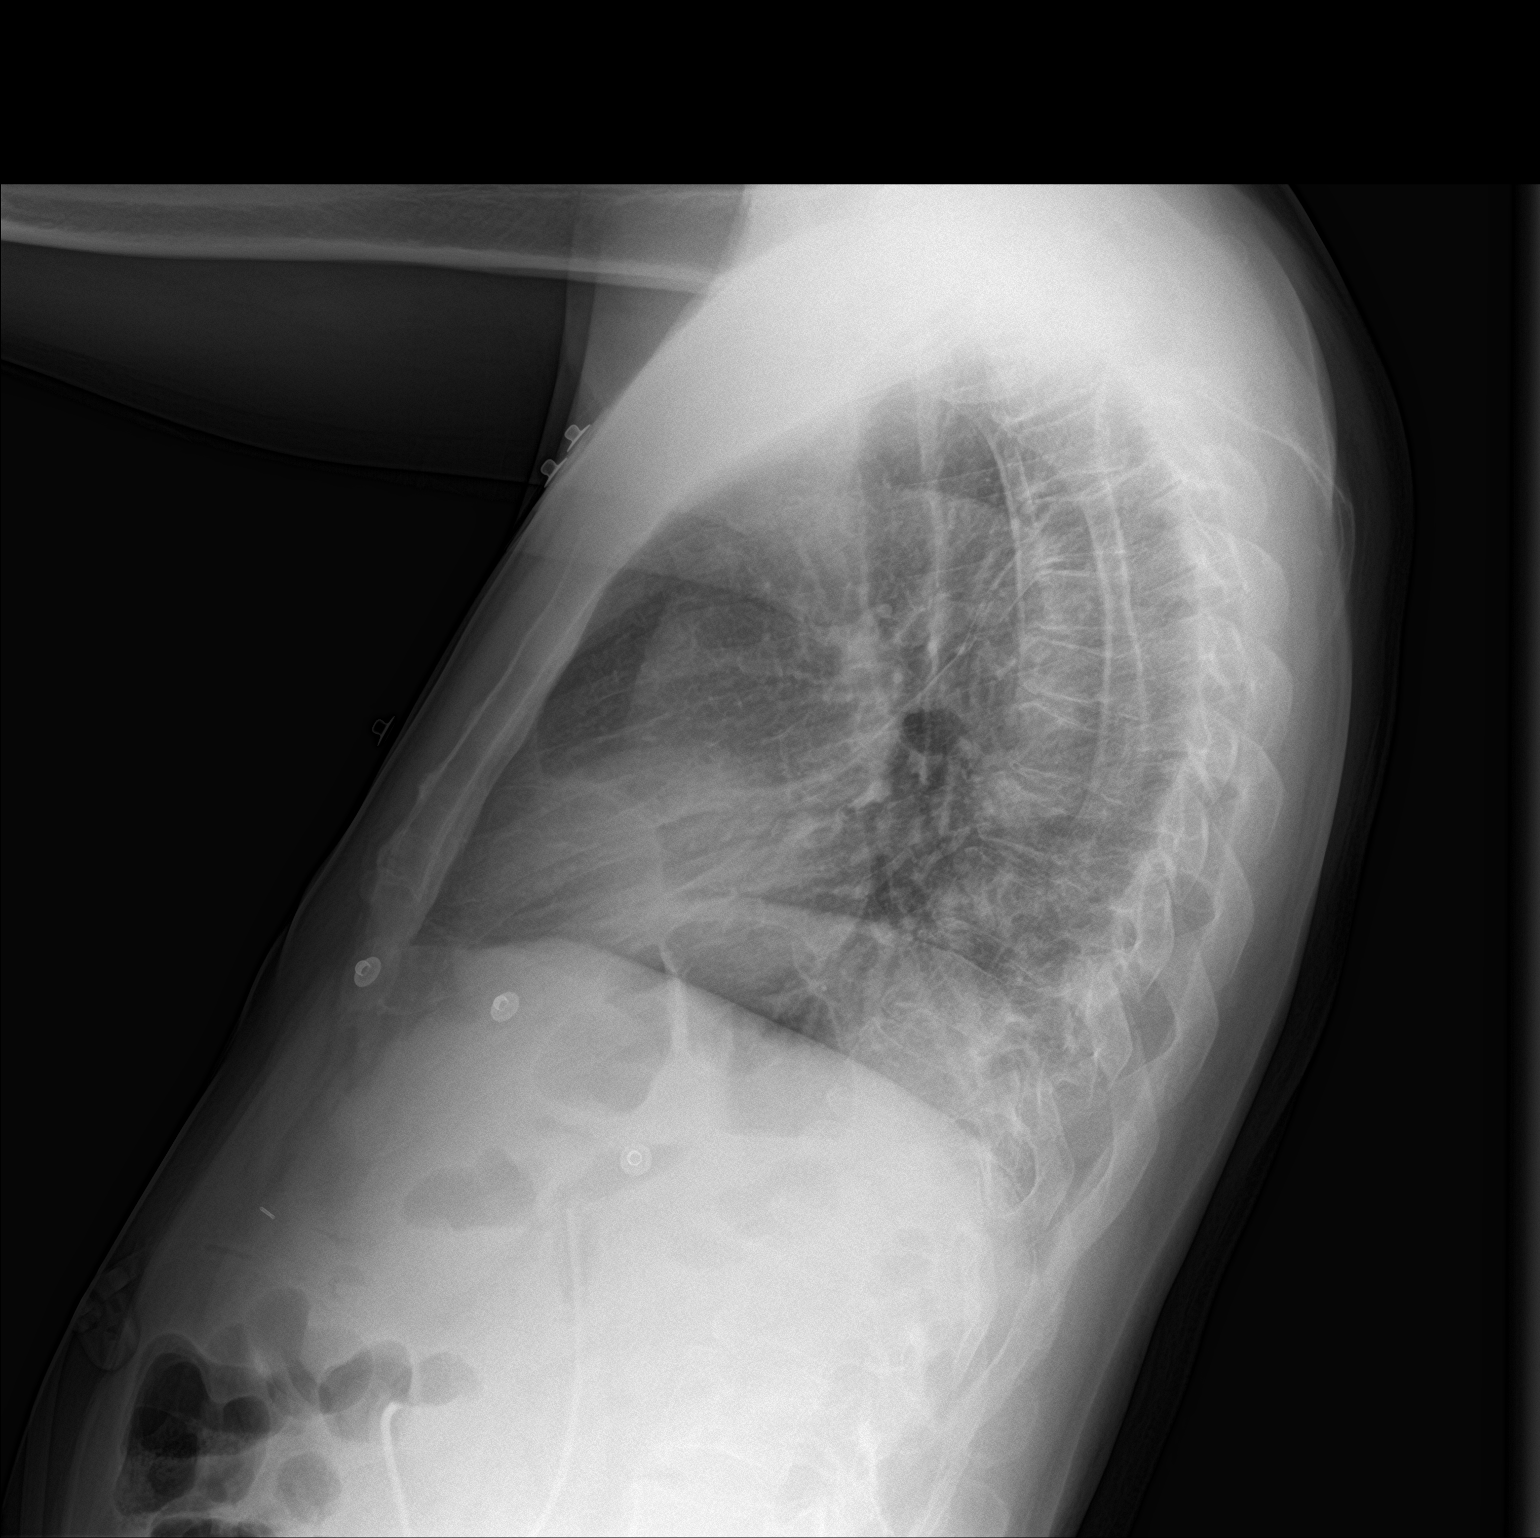

[2 of 2 positions shown; findings below may reference images not displayed]

FINDINGS: There is mild scarring in the left base. There is no edema or
consolidation. Heart size and pulmonary vascularity are normal. No
adenopathy. No pneumothorax. No bone lesions.
IMPRESSION: Mild scarring in the left base. No edema or consolidation. Cardiac
silhouette within normal limits.

## 2020-12-02 IMAGING — CT CT RENAL STONE PROTOCOL
2 of 4 series · 15 of 46 positions shown, 17 images · non-contrast
Comparison: None.

CLINICAL DATA: Left-sided flank pain.

EXAM:
CT ABDOMEN AND PELVIS WITHOUT CONTRAST
TECHNIQUE: Multidetector CT imaging of the abdomen and pelvis was performed
following the standard protocol without IV contrast.

[Series 3: stone study 5.0 i30f 2 · axial · 0.80mm/px · z∈[-470,-70]mm · 12 of 88 slices shown, 14 images]
[im 4/88  soft-tissue]
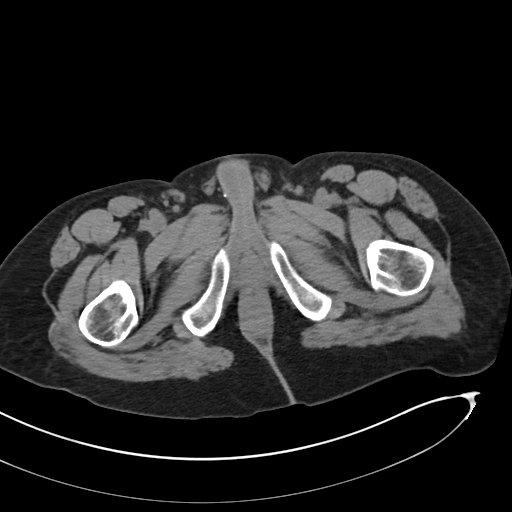
[im 4/88  bone]
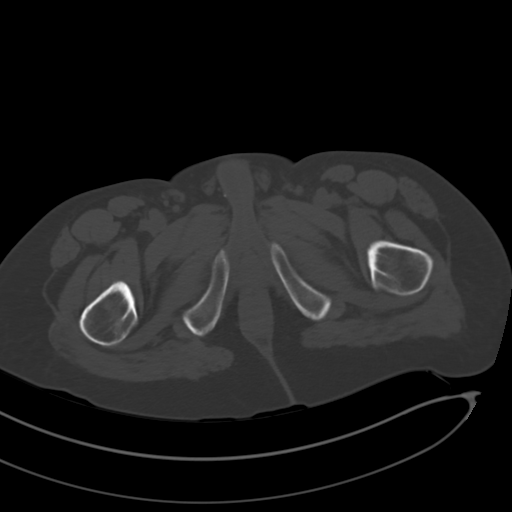
[im 12/88  soft-tissue]
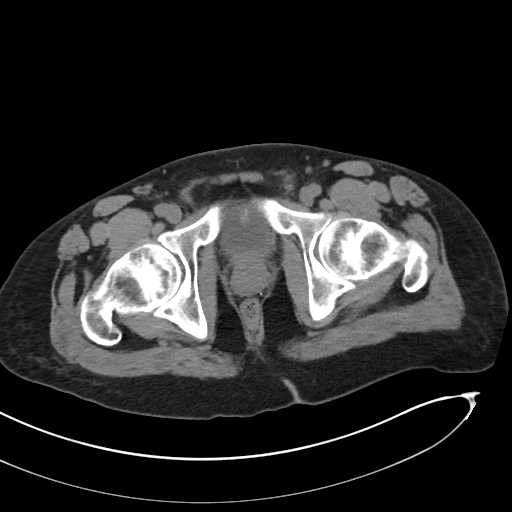
[im 20/88  soft-tissue]
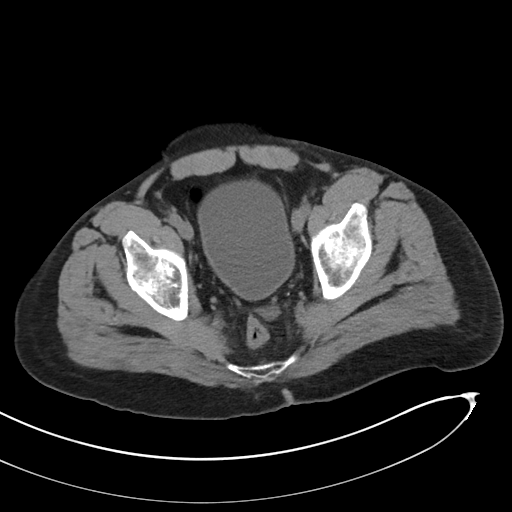
[im 28/88  soft-tissue]
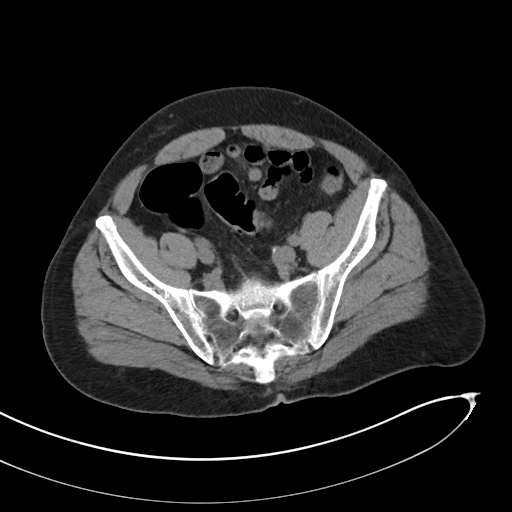
[im 32/88  soft-tissue]
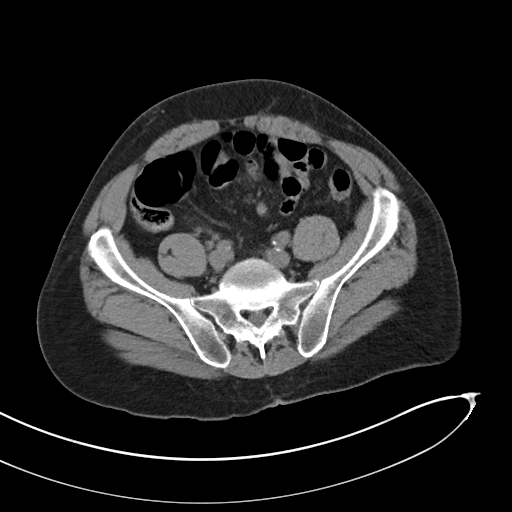
[im 40/88  soft-tissue]
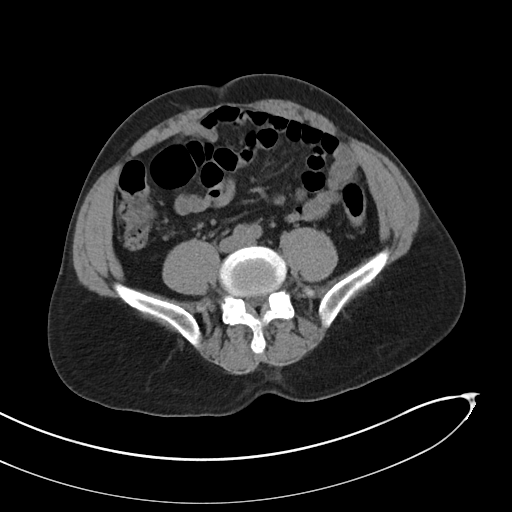
[im 48/88  soft-tissue]
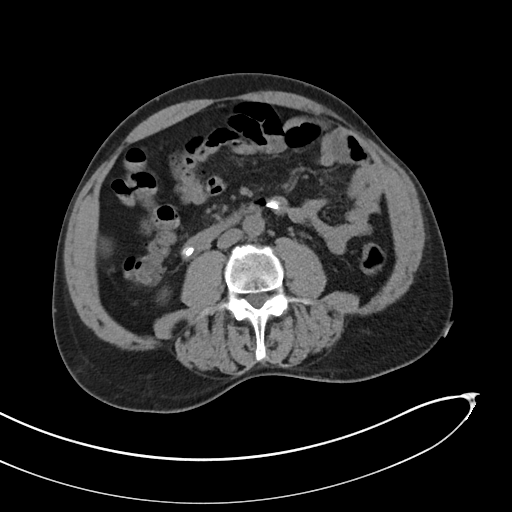
[im 56/88  soft-tissue]
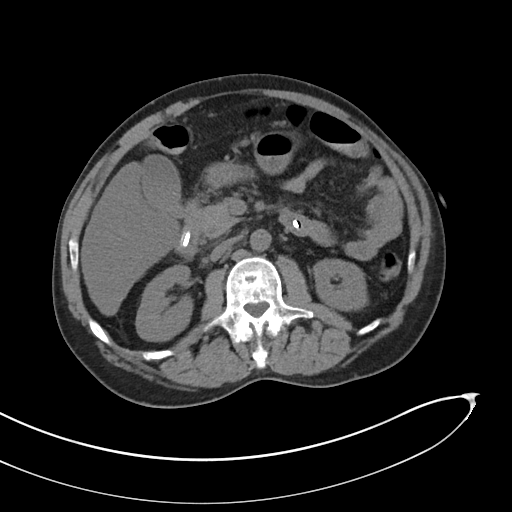
[im 60/88  soft-tissue]
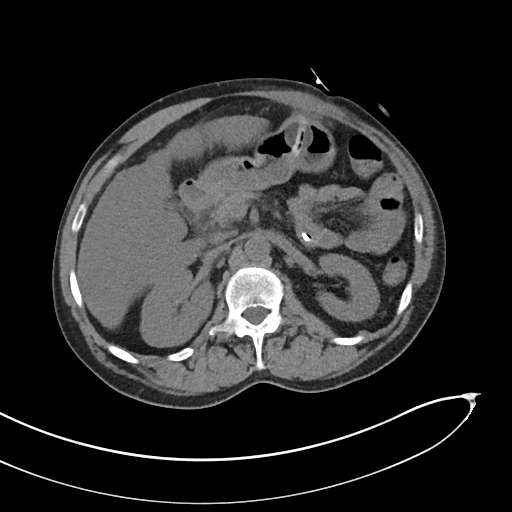
[im 60/88  bone]
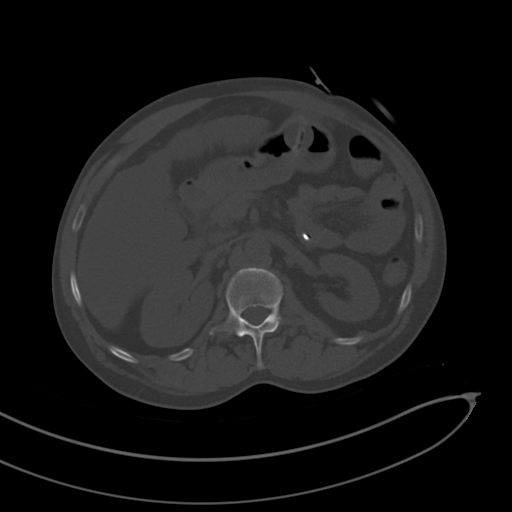
[im 68/88  soft-tissue]
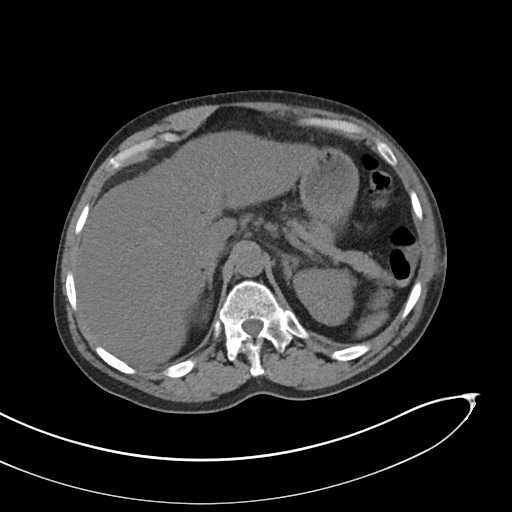
[im 76/88  soft-tissue]
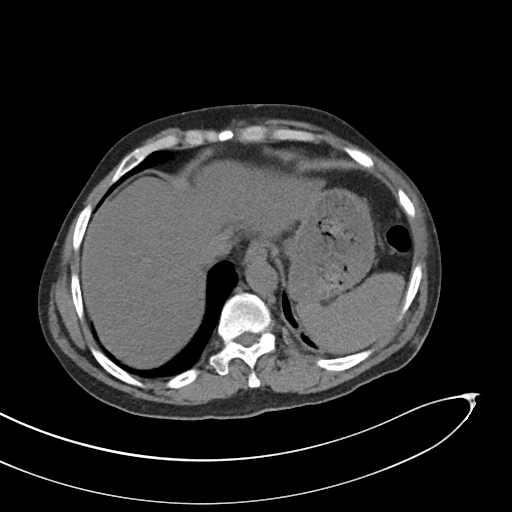
[im 84/88  soft-tissue]
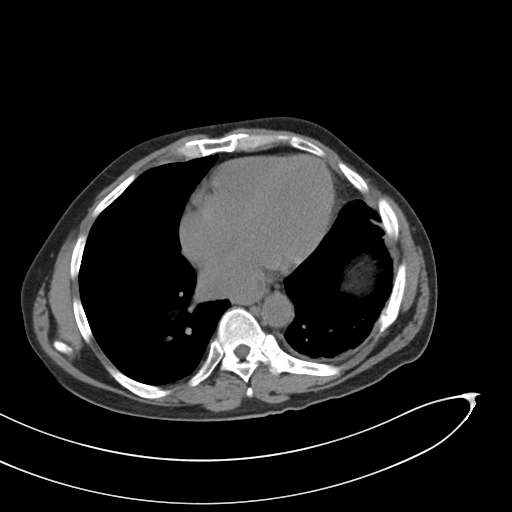

[Series 6: coronal soft tissue · coronal · 0.64mm/px · 3 of 90 slices shown]
[im 30/90  soft-tissue]
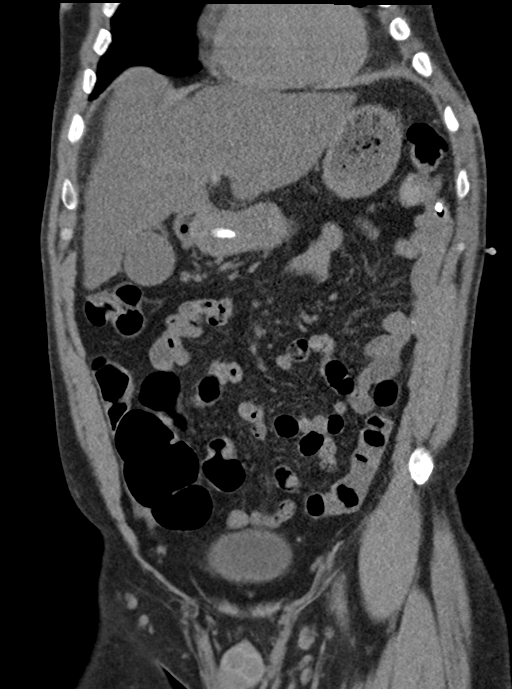
[im 40/90  soft-tissue]
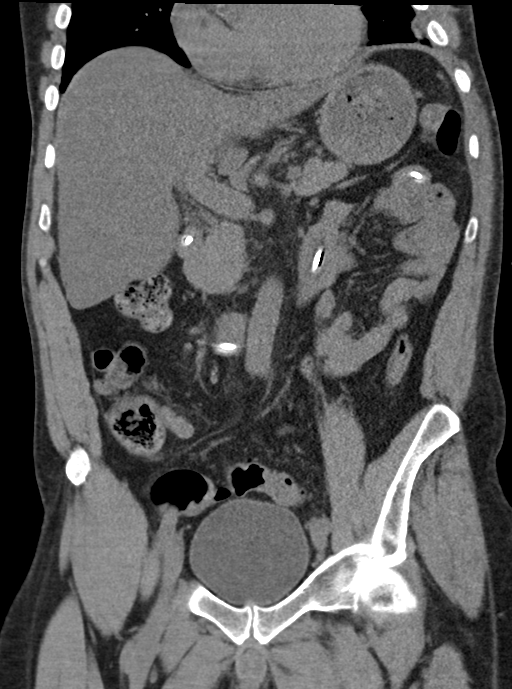
[im 50/90  soft-tissue]
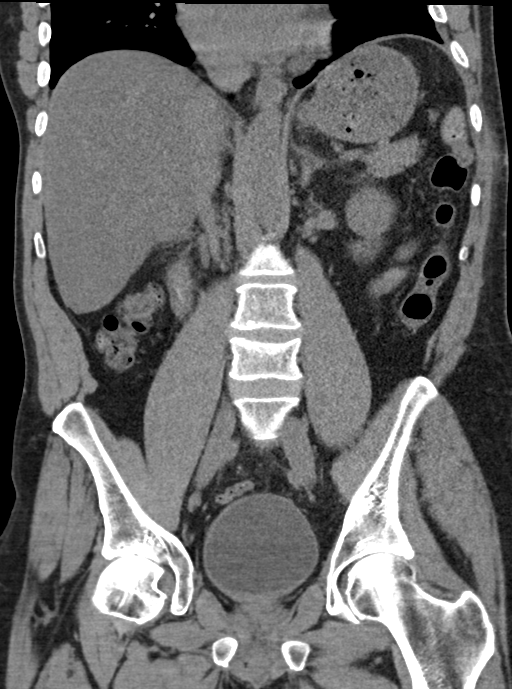

[15 of 46 positions shown; findings below may reference images not displayed]

FINDINGS: Lower chest: There is scarring versus atelectasis at the lung
bases.The heart size is normal.

Hepatobiliary: There is a patent steatosis. There is a nodular
contour of the liver consistent with cirrhosis. Normal
gallbladder.There is no biliary ductal dilation.

Pancreas: Normal contours without ductal dilatation. No
peripancreatic fluid collection.

Spleen: No splenic laceration or hematoma.

Adrenals/Urinary Tract:

--Adrenal glands: No adrenal hemorrhage.

--Right kidney/ureter: No hydronephrosis or perinephric hematoma.

--Left kidney/ureter: No hydronephrosis or perinephric hematoma.

--Urinary bladder: Unremarkable.

Stomach/Bowel:

--Stomach/Duodenum: There is a well-positioned gastrojejunostomy
tube in place. The tip of the catheter terminates in the proximal
jejunum.

--Small bowel: No dilatation or inflammation.

--Colon: No focal abnormality.

--Appendix: Normal.

Vascular/Lymphatic: Atherosclerotic calcification is present within
the non-aneurysmal abdominal aorta, without hemodynamically
significant stenosis.

--No retroperitoneal lymphadenopathy.

--No mesenteric lymphadenopathy.

--No pelvic or inguinal lymphadenopathy.

Reproductive: Unremarkable

Other: No ascites or free air. The abdominal wall is normal.

Musculoskeletal. There are subacute to chronic rib fractures
involving the posterior eighth through tenth ribs on the left. There
is an age-indeterminate compression fracture of the L4 vertebral
body.
IMPRESSION: 1. There are multiple subacute to chronic appearing posterior
left-sided rib fractures as detailed above.
2. Age-indeterminate compression fracture of the L4 vertebral body.
3. Hepatic steatosis with underlying cirrhosis.
4. Well-positioned gastrojejunostomy tube.
5. Scarring versus atelectasis of the left lower lobe.
